# Patient Record
Sex: Male | Born: 1995 | Race: White | Hispanic: No | Marital: Married | Smoking: Never smoker
Health system: Southern US, Community
[De-identification: ages and names within clinical notes are randomized; demographics above are authoritative.]

## PROBLEM LIST (undated history)

## (undated) DIAGNOSIS — F32A Depression, unspecified: Secondary | ICD-10-CM

## (undated) DIAGNOSIS — F419 Anxiety disorder, unspecified: Secondary | ICD-10-CM

## (undated) DIAGNOSIS — K219 Gastro-esophageal reflux disease without esophagitis: Secondary | ICD-10-CM

## (undated) DIAGNOSIS — R569 Unspecified convulsions: Secondary | ICD-10-CM

## (undated) DIAGNOSIS — T7840XA Allergy, unspecified, initial encounter: Secondary | ICD-10-CM

## (undated) DIAGNOSIS — F329 Major depressive disorder, single episode, unspecified: Secondary | ICD-10-CM

## (undated) DIAGNOSIS — F319 Bipolar disorder, unspecified: Secondary | ICD-10-CM

## (undated) HISTORY — PX: TONSILLECTOMY: SUR1361

## (undated) HISTORY — DX: Allergy, unspecified, initial encounter: T78.40XA

## (undated) HISTORY — DX: Unspecified convulsions: R56.9

## (undated) HISTORY — DX: Gastro-esophageal reflux disease without esophagitis: K21.9

---
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
## 2017-03-30 ENCOUNTER — Ambulatory Visit (INDEPENDENT_AMBULATORY_CARE_PROVIDER_SITE_OTHER): Payer: Medicaid Other | Admitting: *Deleted

## 2017-03-30 DIAGNOSIS — Z23 Encounter for immunization: Secondary | ICD-10-CM | POA: Diagnosis not present

## 2017-04-06 ENCOUNTER — Telehealth: Payer: Self-pay | Admitting: Family Medicine

## 2017-04-06 NOTE — Telephone Encounter (Signed)
Pt's mother notified pt can come in to get plain Tetanus But won't need another Tdap Mother verbalizes understanding

## 2017-04-08 ENCOUNTER — Ambulatory Visit: Payer: Medicaid Other | Admitting: Family Medicine

## 2017-04-08 ENCOUNTER — Encounter: Payer: Self-pay | Admitting: Family Medicine

## 2017-04-08 ENCOUNTER — Ambulatory Visit (INDEPENDENT_AMBULATORY_CARE_PROVIDER_SITE_OTHER): Payer: Medicaid Other | Admitting: Family Medicine

## 2017-04-08 VITALS — BP 138/80 | HR 96 | Temp 98.9°F | Ht 63.0 in | Wt 158.0 lb

## 2017-04-08 DIAGNOSIS — J029 Acute pharyngitis, unspecified: Secondary | ICD-10-CM | POA: Diagnosis not present

## 2017-04-08 DIAGNOSIS — J0100 Acute maxillary sinusitis, unspecified: Secondary | ICD-10-CM | POA: Diagnosis not present

## 2017-04-08 DIAGNOSIS — Z23 Encounter for immunization: Secondary | ICD-10-CM

## 2017-04-08 LAB — RAPID STREP SCREEN (MED CTR MEBANE ONLY): STREP GP A AG, IA W/REFLEX: NEGATIVE

## 2017-04-08 LAB — CULTURE, GROUP A STREP

## 2017-04-08 MED ORDER — AMOXICILLIN-POT CLAVULANATE 875-125 MG PO TABS: 1.0000 | ORAL_TABLET | Freq: Two times a day (BID) | ORAL | 0 refills | Status: DC

## 2017-04-08 MED ORDER — TETANUS-DIPHTHERIA TOXOIDS TD 5-2 LFU IM INJ: 0.5000 mL | INJECTION | Freq: Once | INTRAMUSCULAR | Status: DC

## 2017-04-08 NOTE — Progress Notes (Deleted)
Subjective: CC: sinus symptoms PCP: Ryan [name], Ryan [name], Ryan [name] OZH:YQMVHQHPI:Ryan [name] is [name] 21 y.o. male presenting to clinic today for:  1. Sinus symptoms  Patient reports *** that started ***.  *** cough, hemoptysis, congestion, rhinorrhea, sinus pressure, headache, SOB, dizziness, rash, nausea, vomiting, diarrhea, fevers, chills, myalgia, sick contacts, recent travel.  Patient has used *** with *** relief of symptoms.  *** history of COPD or asthma.  *** tobacco use/ exposure.    ROS: Per HPI  Allergies  Allergen Reactions  . Sulfa Antibiotics Rash   Past Medical History:  Diagnosis Date  . Anxiety   . Bipolar affective disorder (HCC)   . Depression   . Seizures (HCC)    seizures began in NOV 2013   No current outpatient medications on file. Social History   Socioeconomic History  . Marital status: Single    Spouse name: Not on file  . Number of children: Not on file  . Years of education: Not on file  . Highest education level: Not on file  Social Needs  . Financial resource strain: Not on file  . Food insecurity - worry: Not on file  . Food insecurity - inability: Not on file  . Transportation needs - medical: Not on file  . Transportation needs - non-medical: Not on file  Occupational History  . Not on file  Tobacco Use  . Smoking status: Never Smoker  . Smokeless tobacco: Never Used  Substance and Sexual Activity  . Alcohol use: No    Alcohol/week: 0.0 oz    Comment: 12-17-2015 per pt no   . Drug use: No    Comment: 12-17-15 per pt no   . Sexual activity: No  Other Topics Concern  . Not on file  Social History Narrative  . Not on file   Family History  Problem Relation Age of Onset  . ADD / ADHD Father   . Mood Disorder Father   . ADD / ADHD Sister   . Mood Disorder Sister   . Bipolar disorder Sister   . ADD / ADHD Brother   . Mood Disorder Brother   . Bipolar disorder Brother   . Depression Maternal Grandfather   . Mood Disorder Paternal Grandfather    . Mood Disorder Paternal Grandmother   . ADD / ADHD Sister   . Mood Disorder Sister   . Bipolar disorder Sister   . ADD / ADHD Brother   . Mood Disorder Brother   . Bipolar disorder Brother     Objective: Office vital signs reviewed. There were no vitals taken for this visit.  Physical Examination:  General: Awake, alert, *** nourished, No acute distress HEENT: Normal    Neck: No masses palpated. No lymphadenopathy    Ears: Tympanic membranes intact, normal light reflex, no erythema, no bulging    Eyes: PERRLA, extraocular membranes intact, sclera ***    Nose: nasal turbinates moist, *** nasal discharge    Throat: moist mucus membranes, no erythema, *** tonsillar exudate.  Airway is patent Cardio: regular rate and rhythm, S1S2 heard, no murmurs appreciated Pulm: clear to auscultation bilaterally, no wheezes, rhonchi or rales; normal work of breathing on room air GI: soft, non-tender, non-distended, bowel sounds present x4, no hepatomegaly, no splenomegaly, no masses GU: external vaginal tissue ***, cervix ***, *** punctate lesions on cervix appreciated, *** discharge from cervical os, *** bleeding, *** cervical motion tenderness, *** abdominal/ adnexal masses Extremities: warm, well perfused, No edema, cyanosis or clubbing; +***  pulses bilaterally MSK: *** gait and *** station Skin: dry; intact; no rashes or lesions Neuro: *** Strength and light touch sensation grossly intact, *** DTRs ***/4  Assessment/ Plan: 21 y.o. male   ***  No orders of the defined types were placed in this encounter.  No orders of the defined types were placed in this encounter.    Raliegh IpAshly M Leevon Upperman, DO [facility] [phone]

## 2017-04-08 NOTE — Progress Notes (Signed)
Subjective: CC: sinus symptoms PCP: [name], Elige RadonJoshua A, MD WUJ:WJXBJYHPI:Ryan [name] is a 21 y.o. male presenting to clinic today for:  1. Sinus symptoms  Patient reports sinus pressure/ headache/ facial pain/ rhinorrhea/ nausea/ postnasal drip and sore throat that started 1.5 weeks ago.  He reports subjective fever.  Denies cough, hemoptysis, SOB, dizziness, rash, vomiting, diarrhea, chills, myalgia, sick contacts, recent travel.  Patient has used Mucinex and neti Pot with little relief of symptoms.  Denies history of COPD or asthma.  Denies tobacco use/ exposure.  ROS: Per HPI  Allergies  Allergen Reactions  . Sulfa Antibiotics Rash   Past Medical History:  Diagnosis Date  . Anxiety   . Bipolar affective disorder (HCC)   . Depression   . Seizures (HCC)    seizures began in NOV 2013   No current outpatient medications on file. Social History   Socioeconomic History  . Marital status: Single    Spouse name: Not on file  . Number of children: Not on file  . Years of education: Not on file  . Highest education level: Not on file  Social Needs  . Financial resource strain: Not on file  . Food insecurity - worry: Not on file  . Food insecurity - inability: Not on file  . Transportation needs - medical: Not on file  . Transportation needs - non-medical: Not on file  Occupational History  . Not on file  Tobacco Use  . Smoking status: Never Smoker  . Smokeless tobacco: Never Used  Substance and Sexual Activity  . Alcohol use: No    Alcohol/week: 0.0 oz    Comment: 12-17-2015 per pt no   . Drug use: No    Comment: 12-17-15 per pt no   . Sexual activity: No  Other Topics Concern  . Not on file  Social History Narrative  . Not on file   Family History  Problem Relation Age of Onset  . ADD / ADHD Father   . Mood Disorder Father   . ADD / ADHD Sister   . Mood Disorder Sister   . Bipolar disorder Sister   . ADD / ADHD Brother   . Mood Disorder Brother   . Bipolar disorder  Brother   . Depression Maternal Grandfather   . Mood Disorder Paternal Grandfather   . Mood Disorder Paternal Grandmother   . ADD / ADHD Sister   . Mood Disorder Sister   . Bipolar disorder Sister   . ADD / ADHD Brother   . Mood Disorder Brother   . Bipolar disorder Brother     Objective: Office vital signs reviewed. BP 138/80   Pulse 96   Temp 98.9 F (37.2 C) (Oral)   Ht 5\' 3"  (1.6 m)   Wt 158 lb (71.7 kg)   BMI 27.99 kg/m   Physical Examination:  General: Awake, alert, well nourished, nontoxic appearing, No acute distress HEENT: + TTP to maxillary sinuses    Neck: No masses palpated. No lymphadenopathy    Ears: Tympanic membranes intact, normal light reflex, no erythema, no bulging    Eyes: PERRLA, extraocular membranes intact, sclera white    Nose: nasal turbinates moist, opaque yellow nasal discharge w/ erythematous nasal turbinates.    Throat: moist mucus membranes, mild oropharyngeal erythema, no tonsillar exudate.  Airway is patent Cardio: regular rate and rhythm, S1S2 heard, no murmurs appreciated Pulm: clear to auscultation bilaterally, no wheezes, rhonchi or rales; normal work of breathing on room air  Assessment/  Plan: 21 y.o. male   1. Acute non-recurrent maxillary sinusitis Patient is afebrile with normal vital signs.  His exam was remarkable for bilateral maxillary sinus tenderness, erythematous nasal turbinates with opaque yellow nasal discharge.  Otherwise unremarkable.  Given the duration and waxing waning characteristics of his symptoms, will empirically treat for acute bacterial sinusitis.  Augmentin 875 p.o. twice daily times 10 days prescribed.  I encouraged him to obtain a probiotic or eat yogurt while on this medication.  Push oral fluids.  Continue Nettie pot as needed. Strict return precautions and reasons for emergent evaluation in the emergency department review with patient.  They voiced understanding and will follow-up as needed.  2. Sore  throat Strep negative. - Rapid Strep Screen (Not at [facility])   Orders Placed This Encounter  Procedures  . Rapid Strep Screen (Not at [facility])   Meds ordered this encounter  Medications  . amoxicillin-clavulanate (AUGMENTIN) 875-125 MG tablet    Sig: Take 1 tablet by mouth 2 (two) times daily.    Dispense:  20 tablet    Refill:  0   Additionally, patient required tetanus vaccine prior to leaving for mission trip.  He notes that he will be away for 2 years and will not [place].  This was administered during today's office visit.  Raliegh IpAshly M Magen Suriano, DO [facility] [phone]

## 2017-04-08 NOTE — Patient Instructions (Signed)
Given the duration of your symptoms and the waxing and waning nature of them, you are being treated with an antibiotic.  As we discussed, take this medication twice daily for the next 10 days.  Diarrhea is a common side effect of this medication.  If you experience this, consider use of probiotic or intake of yogurt.  Make sure to drink plenty of fluid.  - Get plenty of rest and drink plenty of fluids. - Try to breathe moist air. Use a cold mist humidifier. - Consume warm fluids (soup or tea) to provide relief for a stuffy nose and to loosen phlegm. - For nasal stuffiness, try saline nasal spray or a Neti Pot. Afrin nasal spray can also be used but this product should not be used longer than 3 days or it will cause rebound nasal stuffiness (worsening nasal congestion). - For sore throat pain relief: suck on throat lozenges, hard candy or popsicles; gargle with warm salt water (1/4 tsp. salt per 8 oz. of water); and eat soft, bland foods. - Eat a well-balanced diet. If you cannot, ensure you are getting enough nutrients by taking a daily multivitamin. - Avoid dairy products, as they can thicken phlegm. - Avoid alcohol, as it impairs your body's immune system.  CONTACT YOUR DOCTOR IF YOU EXPERIENCE ANY OF THE FOLLOWING: - High fever - Ear pain - Sinus-type headache - Unusually severe cold symptoms - Cough that gets worse while other cold symptoms improve - Flare up of any chronic lung problem, such as asthma - Your symptoms persist longer than 2 weeks

## 2017-04-08 NOTE — Addendum Note (Signed)
Addended byDory Peru: RINTELMANN, GINA C on: 04/08/2017 04:57 PM   Modules accepted: Orders

## 2017-05-06 ENCOUNTER — Telehealth (HOSPITAL_COMMUNITY): Payer: Self-pay | Admitting: *Deleted

## 2017-05-06 NOTE — Telephone Encounter (Signed)
Dr Tenny Crawoss, Patient's mom called & stated that Ryan [name] has been having high anxiety along with depression. They have a appointment on 05-11-17 & have been placed on the cancellation list.  Mom's question  is if you could prescribe him something before the appointment on the 9th? I explained that  a evualation is needed face to face.

## 2017-05-06 NOTE — Telephone Encounter (Signed)
I have not seen him in almost a year. Will need to see him in the office

## 2017-05-09 NOTE — Telephone Encounter (Signed)
Spoke with & that per Dr Tenny Crawoss: [facility] [place] to fill any medications due to I have not seen him in almost a year. Will need to see him in the office

## 2017-05-11 ENCOUNTER — Encounter (HOSPITAL_COMMUNITY): Payer: Self-pay | Admitting: Psychiatry

## 2017-05-11 ENCOUNTER — Ambulatory Visit (INDEPENDENT_AMBULATORY_CARE_PROVIDER_SITE_OTHER): Payer: Self-pay | Admitting: Psychiatry

## 2017-05-11 VITALS — BP 133/78 | HR 67 | Ht 63.0 in | Wt 151.0 lb

## 2017-05-11 DIAGNOSIS — Z818 Family history of other mental and behavioral disorders: Secondary | ICD-10-CM

## 2017-05-11 DIAGNOSIS — F3162 Bipolar disorder, current episode mixed, moderate: Secondary | ICD-10-CM

## 2017-05-11 DIAGNOSIS — G4700 Insomnia, unspecified: Secondary | ICD-10-CM

## 2017-05-11 DIAGNOSIS — F419 Anxiety disorder, unspecified: Secondary | ICD-10-CM

## 2017-05-11 DIAGNOSIS — R450 Nervousness: Secondary | ICD-10-CM

## 2017-05-11 MED ORDER — PAROXETINE HCL 40 MG PO TABS: 40.0000 mg | ORAL_TABLET | Freq: Every day | ORAL | 2 refills | Status: DC

## 2017-05-11 MED ORDER — GABAPENTIN 300 MG PO CAPS: 300.0000 mg | ORAL_CAPSULE | Freq: Three times a day (TID) | ORAL | 2 refills | Status: DC

## 2017-05-11 NOTE — Progress Notes (Signed)
BH MD/PA/NP OP Progress Note  05/11/2017 10:03 AM Ryan [name]  MRN:  [id number]  Chief Complaint:  Chief Complaint    Depression; Anxiety; Follow-up     HPI: Patient is a 22 year old white male who lives with his mother brother and 2 sisters in [place].  He has completed high school and was on a mission for the [employer] but returned early about 1 week ago.  The patient returns for follow-up after a long absence.  He was last seen in February 2018.  He is here with his mother.  He has a history of bipolar disorder depression and ADHD.  He also has had a history of significant anxiety.  When I last saw the patient last year he claimed that he did not need medication anymore.  He was working as a Conservation officer, naturecashier at [employer] and was doing very well.  He denies any symptoms of depression anxiety or difficulty focusing.  He was trying to teach himself Spanish.  The patient left about 4 weeks ago for a mission training center in the [employer].  This was in [place] and he was being trained in Spanish immersion.  After this he was supposed to go to [facility] [employer] [facility] [place] for his actual admission.  However at the training center after about 2 weeks he got increasingly depressed and anxious.  He was not able to sleep or eat.  He had significant panic attacks.  He began entertaining suicidal thoughts without plan.  He was supposed to stay there for 6 weeks but came home after 3 weeks as the administrators of the program did not think he could handle it.  He found some Paxil that he used to take and has begun taking it but is not sure of the dose.  His mother is also found a Veterinary surgeoncounselor in [place] for him to see.  Apparently he will not be allowed to retrain with the Mission program for at least 5 or 6 months.  The patient states that he has been better since he got home.  He is no longer suicidal.  He is taking melatonin and Benadryl to help him sleep.  He is still very anxious but seems a bit less depressed.  He is  to be on gabapentin for anxiety and we can reinstate this as well as continue on the Paxil.  He denies any psychotic symptoms.  He does not have much insight as to why he got like this at the training center.  However his mother describes it is very much like [employer] with 10-12 hours of constant study and training through the day all in [place].  It seems as if it was a little bit overwhelming for him.   Visit Diagnosis:    ICD-10-CM   1. Bipolar 1 disorder, mixed, moderate (HCC) F31.62     Past Psychiatric History: Long-term outpatient treatment for ADHD and anxiety and possible bipolar disorder  Past Medical History:  Past Medical History:  Diagnosis Date  . Anxiety   . Bipolar affective disorder (HCC)   . Depression   . Seizures (HCC)    seizures began in NOV 2013    Past Surgical History:  Procedure Laterality Date  . TONSILLECTOMY      Family Psychiatric History: See below  Family History:  Family History  Problem Relation Age of Onset  . ADD / ADHD Father   . Mood Disorder Father   . ADD / ADHD Sister   . Mood Disorder Sister   .  Bipolar disorder Sister   . ADD / ADHD Brother   . Mood Disorder Brother   . Bipolar disorder Brother   . Depression Maternal Grandfather   . Mood Disorder Paternal Grandfather   . Mood Disorder Paternal Grandmother   . ADD / ADHD Sister   . Mood Disorder Sister   . Bipolar disorder Sister   . ADD / ADHD Brother   . Mood Disorder Brother   . Bipolar disorder Brother     Social History:  Social History   Socioeconomic History  . Marital status: Single    Spouse name: None  . Number of children: None  . Years of education: None  . Highest education level: None  Social Needs  . Financial resource strain: None  . Food insecurity - worry: None  . Food insecurity - inability: None  . Transportation needs - medical: None  . Transportation needs - non-medical: None  Occupational History  . None  Tobacco Use  . Smoking status:  Never Smoker  . Smokeless tobacco: Never Used  Substance and Sexual Activity  . Alcohol use: No    Alcohol/week: 0.0 oz    Comment: 12-17-2015 per pt no   . Drug use: No    Comment: 12-17-15 per pt no   . Sexual activity: No  Other Topics Concern  . None  Social History Narrative  . None    Allergies:  Allergies  Allergen Reactions  . Sulfa Antibiotics Rash    Metabolic Disorder Labs: No results found for: HGBA1C, MPG No results found for: PROLACTIN No results found for: CHOL, TRIG, HDL, CHOLHDL, VLDL, LDLCALC No results found for: TSH  Therapeutic Level Labs: No results found for: LITHIUM Lab Results  Component Value Date   VALPROATE 106 (H) 05/18/2013   VALPROATE 64.9 03/10/2012   No components found for:  CBMZ  Current Medications: Current Outpatient Medications  Medication Sig Dispense Refill  . amoxicillin-clavulanate (AUGMENTIN) 875-125 MG tablet Take 1 tablet by mouth 2 (two) times daily. (Patient not taking: Reported on 05/11/2017) 20 tablet 0  . gabapentin (NEURONTIN) 300 MG capsule Take 1 capsule (300 mg total) by mouth 3 (three) times daily. 90 capsule 2  . PARoxetine (PAXIL) 40 MG tablet Take 1 tablet (40 mg total) by mouth daily. 30 tablet 2   No current facility-administered medications for this visit.      Musculoskeletal: Strength & Muscle Tone: within normal limits Gait & Station: normal Patient leans: N/A  Psychiatric Specialty Exam: Review of Systems  Psychiatric/Behavioral: Positive for depression. The patient is nervous/anxious and has insomnia.   All other systems reviewed and are negative.   Blood pressure 133/78, pulse 67, height 5\' 3"  (1.6 m), weight 151 lb (68.5 kg), SpO2 97 %.Body mass index is 26.75 kg/m.  General Appearance: Casual and Fairly Groomed  Eye Contact:  Good  Speech:  Clear and Coherent  Volume:  Normal  Mood:  Anxious and Dysphoric  Affect:  Congruent  Thought Process:  Goal Directed  Orientation:  Full (Time,  Place, and Person)  Thought Content: Rumination   Suicidal Thoughts:  No  Homicidal Thoughts:  No  Memory:  Immediate;   Good Recent;   Good Remote;   Fair  Judgement:  Fair  Insight:  Lacking  Psychomotor Activity:  Normal  Concentration:  Concentration: Fair and Attention Span: Fair  Recall:  Good  Fund of Knowledge: Good  Language: Good  Akathisia:  No  Handed:  Right  AIMS (if  indicated): not done  Assets:  Communication Skills Desire for Improvement Physical Health Resilience Social Support Talents/Skills  ADL's:  Intact  Cognition: WNL  Sleep:  Fair   Screenings: PHQ2-9     Office Visit from 04/08/2017 in [facility] Office Visit from 11/10/2016 in [facility] Office Visit from 09/28/2016 in [facility] Office Visit from 12/12/2015 in [facility]  PHQ-2 Total Score  0  0  1  0       Assessment and Plan: This patient is a 22 year old male with a history of ADHD anxiety and possibly bipolar disorder.  He was thrust into a new environment in a different country with a very intense rigorous schedule.  This seems to have been too much for him.  Fortunately he is going to be receiving counseling.  He will continue Paxil 40 mg daily for depression and anxiety and added gabapentin 300 mg 3 times a day for anxiety as well.  He will return to see me in 4 weeks   Diannia Ruder, MD 05/11/2017, 10:03 AM

## 2017-06-20 ENCOUNTER — Ambulatory Visit (HOSPITAL_COMMUNITY): Payer: Medicaid Other | Admitting: Psychiatry

## 2017-07-12 ENCOUNTER — Ambulatory Visit (INDEPENDENT_AMBULATORY_CARE_PROVIDER_SITE_OTHER): Payer: Medicaid Other | Admitting: Psychiatry

## 2017-07-12 ENCOUNTER — Encounter (HOSPITAL_COMMUNITY): Payer: Self-pay | Admitting: Psychiatry

## 2017-07-12 VITALS — BP 115/58 | HR 67 | Ht 63.0 in | Wt 158.0 lb

## 2017-07-12 DIAGNOSIS — F3162 Bipolar disorder, current episode mixed, moderate: Secondary | ICD-10-CM

## 2017-07-12 DIAGNOSIS — Z818 Family history of other mental and behavioral disorders: Secondary | ICD-10-CM | POA: Diagnosis not present

## 2017-07-12 MED ORDER — GABAPENTIN 300 MG PO CAPS: 300.0000 mg | ORAL_CAPSULE | Freq: Three times a day (TID) | ORAL | 2 refills | Status: DC

## 2017-07-12 MED ORDER — PAROXETINE HCL 40 MG PO TABS: 40.0000 mg | ORAL_TABLET | Freq: Every day | ORAL | 2 refills | Status: DC

## 2017-07-12 NOTE — Progress Notes (Signed)
BH MD/PA/NP OP Progress Note  07/12/2017 11:12 AM Ryan [name]  MRN:  [id number]  Chief Complaint:  Chief Complaint    Depression; Anxiety; Follow-up     HPI: This patient is a [age] year old white male who lives with his family in [place].  He had completed high school and was on a mission for the [employer] but returned at the end of December due to a severe anxiety.  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  At that time he had just come back from the mission training center for the [employer] in [place].  He got overwhelmed depressed and anxious and had to come home.  He is now working for his uncle and is doing much better.  His mood is good he is sleeping fairly well with melatonin and his anxiety is under good control.  He is trying to get a little bit of exercise but is spending a lot of time at work.  He no longer has any suicidal thoughts and seems very positive Visit Diagnosis:    ICD-10-CM   1. Bipolar 1 disorder, mixed, moderate (HCC) F31.62     Past Psychiatric History: Long-term outpatient treatment for ADHD anxiety and possible bipolar disorder  Past Medical History:  Past Medical History:  Diagnosis Date  . Anxiety   . Bipolar affective disorder (HCC)   . Depression   . Seizures (HCC)    seizures began in NOV 2013    Past Surgical History:  Procedure Laterality Date  . TONSILLECTOMY      Family Psychiatric History: See below  Family History:  Family History  Problem Relation Age of Onset  . ADD / ADHD Father   . Mood Disorder Father   . ADD / ADHD Sister   . Mood Disorder Sister   . Bipolar disorder Sister   . ADD / ADHD Brother   . Mood Disorder Brother   . Bipolar disorder Brother   . Depression Maternal Grandfather   . Mood Disorder Paternal Grandfather   . Mood Disorder Paternal Grandmother   . ADD / ADHD Sister   . Mood Disorder Sister   . Bipolar disorder Sister   . ADD /  ADHD Brother   . Mood Disorder Brother   . Bipolar disorder Brother     Social History:  Social History   Socioeconomic History  . Marital status: Single    Spouse name: None  . Number of children: None  . Years of education: None  . Highest education level: None  Social Needs  . Financial resource strain: None  . Food insecurity - worry: None  . Food insecurity - inability: None  . Transportation needs - medical: None  . Transportation needs - non-medical: None  Occupational History  . None  Tobacco Use  . Smoking status: Never Smoker  . Smokeless tobacco: Never Used  Substance and Sexual Activity  . Alcohol use: No    Alcohol/week: 0.0 oz    Comment: 12-17-2015 per pt no   . Drug use: No    Comment: 12-17-15 per pt no   . Sexual activity: No  Other Topics Concern  . None  Social History Narrative  . None    Allergies:  Allergies  Allergen Reactions  . Sulfa Antibiotics Rash    Metabolic Disorder Labs: No results found for: HGBA1C, MPG No results found for: PROLACTIN No results found  for: CHOL, TRIG, HDL, CHOLHDL, VLDL, LDLCALC No results found for: TSH  Therapeutic Level Labs: No results found for: LITHIUM Lab Results  Component Value Date   VALPROATE 106 (H) 05/18/2013   VALPROATE 64.9 03/10/2012   No components found for:  CBMZ  Current Medications: Current Outpatient Medications  Medication Sig Dispense Refill  . gabapentin (NEURONTIN) 300 MG capsule Take 1 capsule (300 mg total) by mouth 3 (three) times daily. 90 capsule 2  . PARoxetine (PAXIL) 40 MG tablet Take 1 tablet (40 mg total) by mouth daily. 30 tablet 2  . amoxicillin-clavulanate (AUGMENTIN) 875-125 MG tablet Take 1 tablet by mouth 2 (two) times daily. (Patient not taking: Reported on 07/12/2017) 20 tablet 0   No current facility-administered medications for this visit.      Musculoskeletal: Strength & Muscle Tone: within normal limits Gait & Station: normal Patient leans:  N/A  Psychiatric Specialty Exam: Review of Systems  All other systems reviewed and are negative.   Blood pressure (!) 115/58, pulse 67, height 5\' 3"  (1.6 m), weight 158 lb (71.7 kg), SpO2 98 %.Body mass index is 27.99 kg/m.  General Appearance: Casual and Fairly Groomed  Eye Contact:  Good  Speech:  Clear and Coherent  Volume:  Normal  Mood:  Euthymic  Affect:  Congruent  Thought Process:  Goal Directed  Orientation:  Full (Time, Place, and Person)  Thought Content: WDL   Suicidal Thoughts:  No  Homicidal Thoughts:  No  Memory:  Immediate;   Good Recent;   Good Remote;   Good  Judgement:  Fair  Insight:  Fair  Psychomotor Activity:  Normal  Concentration:  Concentration: Good and Attention Span: Good  Recall:  Good  Fund of Knowledge: Good  Language: Good  Akathisia:  No  Handed:  Right  AIMS (if indicated): not done  Assets:  Communication Skills Desire for Improvement Physical Health Resilience Social Support Talents/Skills  ADL's:  Intact  Cognition: WNL  Sleep:  Fair   Screenings: PHQ2-9     Office Visit from 04/08/2017 in [facility] Office Visit from 11/10/2016 in [facility] Office Visit from 09/28/2016 in [facility] Office Visit from 12/12/2015 in [facility]  PHQ-2 Total Score  0  0  1  0       Assessment and Plan: This patient is a 22 year old male with a history of mood disorder and anxiety.  He is doing well on his current regimen and does not feel like he needs medication for focus right now.  He will continue Paxil 40 mg daily for anxiety and depression and gabapentin 300 mg 3 times a day for anxiety.  He will return to see me in 3 months   Diannia Ruder, MD 07/12/2017, 11:12 AM

## 2017-09-10 DIAGNOSIS — M79646 Pain in unspecified finger(s): Secondary | ICD-10-CM | POA: Diagnosis not present

## 2017-09-10 DIAGNOSIS — S61219A Laceration without foreign body of unspecified finger without damage to nail, initial encounter: Secondary | ICD-10-CM | POA: Diagnosis not present

## 2017-10-13 ENCOUNTER — Telehealth (HOSPITAL_COMMUNITY): Payer: Self-pay | Admitting: *Deleted

## 2017-10-13 ENCOUNTER — Ambulatory Visit (HOSPITAL_COMMUNITY): Payer: Medicaid Other | Admitting: Psychiatry

## 2017-10-13 ENCOUNTER — Other Ambulatory Visit (HOSPITAL_COMMUNITY): Payer: Self-pay | Admitting: Psychiatry

## 2017-10-13 DIAGNOSIS — F988 Other specified behavioral and emotional disorders with onset usually occurring in childhood and adolescence: Secondary | ICD-10-CM

## 2017-10-13 MED ORDER — DEXMETHYLPHENIDATE HCL ER 20 MG PO CP24: 20.0000 mg | ORAL_CAPSULE | Freq: Every day | ORAL | 0 refills | Status: DC

## 2017-10-13 MED ORDER — GABAPENTIN 300 MG PO CAPS: 300.0000 mg | ORAL_CAPSULE | Freq: Three times a day (TID) | ORAL | 2 refills | Status: DC

## 2017-10-13 MED ORDER — PAROXETINE HCL 40 MG PO TABS: 40.0000 mg | ORAL_TABLET | Freq: Every day | ORAL | 2 refills | Status: DC

## 2017-10-13 NOTE — Telephone Encounter (Signed)
Sent , also focalin xr 20 mg

## 2017-10-13 NOTE — Telephone Encounter (Signed)
Dr Tenny Crawoss Appointment was rescheduled per Mom phone tree message gave different appointment time. Mom is requesting Refill on all Medication's. 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 Rescheduled next appointment is 11-08-17

## 2017-11-08 ENCOUNTER — Ambulatory Visit (INDEPENDENT_AMBULATORY_CARE_PROVIDER_SITE_OTHER): Payer: Medicaid Other | Admitting: Psychiatry

## 2017-11-08 ENCOUNTER — Encounter (HOSPITAL_COMMUNITY): Payer: Self-pay | Admitting: Psychiatry

## 2017-11-08 VITALS — BP 125/75 | HR 88 | Ht 63.0 in | Wt 168.0 lb

## 2017-11-08 DIAGNOSIS — F900 Attention-deficit hyperactivity disorder, predominantly inattentive type: Secondary | ICD-10-CM | POA: Diagnosis not present

## 2017-11-08 DIAGNOSIS — F3162 Bipolar disorder, current episode mixed, moderate: Secondary | ICD-10-CM | POA: Diagnosis not present

## 2017-11-08 MED ORDER — LISDEXAMFETAMINE DIMESYLATE 20 MG PO CAPS: 20.0000 mg | ORAL_CAPSULE | Freq: Every day | ORAL | 0 refills | Status: DC

## 2017-11-08 MED ORDER — PAROXETINE HCL 40 MG PO TABS: 40.0000 mg | ORAL_TABLET | Freq: Every day | ORAL | 2 refills | Status: DC

## 2017-11-08 MED ORDER — GABAPENTIN 300 MG PO CAPS: 300.0000 mg | ORAL_CAPSULE | Freq: Three times a day (TID) | ORAL | 2 refills | Status: DC

## 2017-11-08 NOTE — Progress Notes (Signed)
BH MD/PA/NP OP Progress Note  11/08/2017 3:15 PM Ryan [name]  MRN:  [id number]  Chief Complaint:  Chief Complaint    ADHD; Depression; Anxiety; Follow-up     HPI: This patient is a 22 year old white male who lives with his family in [place].  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  The patient returns with his mother after 4 months.  He states that overall he is doing fairly well.  His mood is under good control and he is no longer depressed or anxious.  He denies any thoughts of self-harm or suicide.  He is having trouble focusing and would like to go back on Vyvanse but other than that he has no specific complaints he states that he loves doing his job. Visit Diagnosis:    ICD-10-CM   1. Attention deficit hyperactivity disorder (ADHD), predominantly inattentive type F90.0   2. Bipolar 1 disorder, mixed, moderate (HCC) F31.62     Past Psychiatric History: Long-term outpatient treatment for ADHD anxiety and possible bipolar disorder  Past Medical History:  Past Medical History:  Diagnosis Date  . Anxiety   . Bipolar affective disorder (HCC)   . Depression   . Seizures (HCC)    seizures began in NOV 2013    Past Surgical History:  Procedure Laterality Date  . TONSILLECTOMY      Family Psychiatric History: See below  Family History:  Family History  Problem Relation Age of Onset  . ADD / ADHD Father   . Mood Disorder Father   . ADD / ADHD Sister   . Mood Disorder Sister   . Bipolar disorder Sister   . ADD / ADHD Brother   . Mood Disorder Brother   . Bipolar disorder Brother   . Depression Maternal Grandfather   . Mood Disorder Paternal Grandfather   . Mood Disorder Paternal Grandmother   . ADD / ADHD Sister   . Mood Disorder Sister   . Bipolar disorder Sister   . ADD / ADHD Brother   . Mood Disorder Brother   . Bipolar disorder Brother     Social History:  Social History   Socioeconomic History  . Marital status: Single   Spouse name: Not on file  . Number of children: Not on file  . Years of education: Not on file  . Highest education level: Not on file  Occupational History  . Not on file  Social Needs  . Financial resource strain: Not on file  . Food insecurity:    Worry: Not on file    Inability: Not on file  . Transportation needs:    Medical: Not on file    Non-medical: Not on file  Tobacco Use  . Smoking status: Never Smoker  . Smokeless tobacco: Never Used  Substance and Sexual Activity  . Alcohol use: No    Alcohol/week: 0.0 oz    Comment: 12-17-2015 per pt no   . Drug use: No    Comment: 12-17-15 per pt no   . Sexual activity: Never  Lifestyle  . Physical activity:    Days per week: Not on file    Minutes per session: Not on file  . Stress: Not on file  Relationships  . Social connections:    Talks on phone: Not on file    Gets together: Not on file    Attends religious service: Not on file    Active member of club or organization: Not on file  Attends meetings of clubs or organizations: Not on file    Relationship status: Not on file  Other Topics Concern  . Not on file  Social History Narrative  . Not on file    Allergies:  Allergies  Allergen Reactions  . Sulfa Antibiotics Rash    Metabolic Disorder Labs: No results found for: HGBA1C, MPG No results found for: PROLACTIN No results found for: CHOL, TRIG, HDL, CHOLHDL, VLDL, LDLCALC No results found for: TSH  Therapeutic Level Labs: No results found for: LITHIUM Lab Results  Component Value Date   VALPROATE 106 (H) 05/18/2013   VALPROATE 64.9 03/10/2012   No components found for:  CBMZ  Current Medications: Current Outpatient Medications  Medication Sig Dispense Refill  . amoxicillin-clavulanate (AUGMENTIN) 875-125 MG tablet Take 1 tablet by mouth 2 (two) times daily. 20 tablet 0  . gabapentin (NEURONTIN) 300 MG capsule Take 1 capsule (300 mg total) by mouth 3 (three) times daily. 90 capsule 2  .  PARoxetine (PAXIL) 40 MG tablet Take 1 tablet (40 mg total) by mouth daily. 30 tablet 2  . lisdexamfetamine (VYVANSE) 20 MG capsule Take 1 capsule (20 mg total) by mouth daily. 30 capsule 0  . lisdexamfetamine (VYVANSE) 20 MG capsule Take 1 capsule (20 mg total) by mouth daily. 30 capsule 0  . lisdexamfetamine (VYVANSE) 20 MG capsule Take 1 capsule (20 mg total) by mouth daily. 30 capsule 0   No current facility-administered medications for this visit.      Musculoskeletal: Strength & Muscle Tone: within normal limits Gait & Station: normal Patient leans: N/A  Psychiatric Specialty Exam: Review of Systems  All other systems reviewed and are negative.   Blood pressure 125/75, pulse 88, height 5\' 3"  (1.6 m), weight 168 lb (76.2 kg), SpO2 99 %.Body mass index is 29.76 kg/m.  General Appearance: Casual and Fairly Groomed  Eye Contact:  Good  Speech:  Clear and Coherent  Volume:  Normal  Mood:  Euthymic  Affect:  Congruent  Thought Process:  Goal Directed  Orientation:  Full (Time, Place, and Person)  Thought Content: WDL   Suicidal Thoughts:  No  Homicidal Thoughts:  No  Memory:  Immediate;   Good Recent;   Good Remote;   Good  Judgement:  Good  Insight:  Fair  Psychomotor Activity:  Normal  Concentration:  Concentration: Fair and Attention Span: Fair  Recall:  Good  Fund of Knowledge: Good  Language: Good  Akathisia:  No  Handed:  Right  AIMS (if indicated): not done  Assets:  Communication Skills Desire for Improvement Physical Health Resilience Social Support Talents/Skills  ADL's:  Intact  Cognition: WNL  Sleep:  Good   Screenings: PHQ2-9     Office Visit from 04/08/2017 in [facility] Office Visit from 11/10/2016 in [facility] Office Visit from 09/28/2016 in [facility] Office Visit from 12/12/2015 in [facility]  PHQ-2 Total Score  0  0  1  0       Assessment and  Plan: Patient is a 22 year old male with a history of depression anxiety and ADHD.  He is doing well on his current regimen.  He will continue Paxil 40 mg daily for depression and anxiety, gabapentin 300 mg 3 times daily for anxiety and will restart Vyvanse 20 mg every morning for focus.  He will return to see me in 3 months   Diannia Ruder, MD 11/08/2017, 3:15 PM

## 2018-02-08 ENCOUNTER — Ambulatory Visit (INDEPENDENT_AMBULATORY_CARE_PROVIDER_SITE_OTHER): Payer: Medicaid Other | Admitting: Psychiatry

## 2018-02-08 ENCOUNTER — Encounter (HOSPITAL_COMMUNITY): Payer: Self-pay | Admitting: Psychiatry

## 2018-02-08 VITALS — BP 130/70 | HR 77 | Ht 63.0 in | Wt 173.0 lb

## 2018-02-08 DIAGNOSIS — F900 Attention-deficit hyperactivity disorder, predominantly inattentive type: Secondary | ICD-10-CM | POA: Diagnosis not present

## 2018-02-08 DIAGNOSIS — F3162 Bipolar disorder, current episode mixed, moderate: Secondary | ICD-10-CM | POA: Diagnosis not present

## 2018-02-08 MED ORDER — PAROXETINE HCL 20 MG PO TABS: 30.0000 mg | ORAL_TABLET | Freq: Every day | ORAL | 2 refills | Status: DC

## 2018-02-08 MED ORDER — LISDEXAMFETAMINE DIMESYLATE 20 MG PO CAPS: 20.0000 mg | ORAL_CAPSULE | Freq: Every day | ORAL | 0 refills | Status: DC

## 2018-02-08 MED ORDER — GABAPENTIN 300 MG PO CAPS: 300.0000 mg | ORAL_CAPSULE | Freq: Three times a day (TID) | ORAL | 2 refills | Status: DC

## 2018-02-08 MED ORDER — PAROXETINE HCL 20 MG PO TABS
30.0000 mg | ORAL_TABLET | Freq: Every day | ORAL | 2 refills | Status: DC
Start: 2018-02-08 — End: 2018-02-08

## 2018-02-08 NOTE — Progress Notes (Signed)
BH MD/PA/NP OP Progress Note  02/08/2018 9:49 AM Ryan [name]  MRN:  [id number]  Chief Complaint:  Chief Complaint    Depression; Anxiety; ADHD; Follow-up     HPI: This patient is a 22 year old single white male who lives with his family in [place].  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  The patient returns on his own today.  He states that he is doing quite well.  He really enjoys his job and is learning a good deal.  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  His mood is under good control he is no longer depressed or anxious.  He is sleeping well and his energy is good.  He is focusing well with the Vyvanse.  He does state that he is drowsy through the day and I suggested that we cut back on the Paxil that he takes at night as it can be a very sedating medication.  He is in agreement. Visit Diagnosis:    ICD-10-CM   1. Attention deficit hyperactivity disorder (ADHD), predominantly inattentive type F90.0   2. Bipolar 1 disorder, mixed, moderate (HCC) F31.62     Past Psychiatric History: Long-term outpatient treatment for ADHD anxiety and possible bipolar disorder  Past Medical History:  Past Medical History:  Diagnosis Date  . Anxiety   . Bipolar affective disorder (HCC)   . Depression   . Seizures (HCC)    seizures began in NOV 2013    Past Surgical History:  Procedure Laterality Date  . TONSILLECTOMY      Family Psychiatric History: See below  Family History:  Family History  Problem Relation Age of Onset  . ADD / ADHD Father   . Mood Disorder Father   . ADD / ADHD Sister   . Mood Disorder Sister   . Bipolar disorder Sister   . ADD / ADHD Brother   . Mood Disorder Brother   . Bipolar disorder Brother   . Depression Maternal Grandfather   . Mood Disorder Paternal Grandfather   . Mood Disorder Paternal Grandmother   . ADD / ADHD Sister   . Mood Disorder Sister   . Bipolar disorder Sister   . ADD  / ADHD Brother   . Mood Disorder Brother   . Bipolar disorder Brother     Social History:  Social History   Socioeconomic History  . Marital status: Single    Spouse name: Not on file  . Number of children: Not on file  . Years of education: Not on file  . Highest education level: Not on file  Occupational History  . Not on file  Social Needs  . Financial resource strain: Not on file  . Food insecurity:    Worry: Not on file    Inability: Not on file  . Transportation needs:    Medical: Not on file    Non-medical: Not on file  Tobacco Use  . Smoking status: Never Smoker  . Smokeless tobacco: Never Used  Substance and Sexual Activity  . Alcohol use: No    Alcohol/week: 0.0 standard drinks    Comment: 12-17-2015 per pt no   . Drug use: No    Comment: 12-17-15 per pt no   . Sexual activity: Never  Lifestyle  . Physical activity:    Days per week: Not on file    Minutes per session: Not on file  . Stress: Not on file  Relationships  .  Social connections:    Talks on phone: Not on file    Gets together: Not on file    Attends religious service: Not on file    Active member of club or organization: Not on file    Attends meetings of clubs or organizations: Not on file    Relationship status: Not on file  Other Topics Concern  . Not on file  Social History Narrative  . Not on file    Allergies:  Allergies  Allergen Reactions  . Sulfa Antibiotics Rash    Metabolic Disorder Labs: No results found for: HGBA1C, MPG No results found for: PROLACTIN No results found for: CHOL, TRIG, HDL, CHOLHDL, VLDL, LDLCALC No results found for: TSH  Therapeutic Level Labs: No results found for: LITHIUM Lab Results  Component Value Date   VALPROATE 106 (H) 05/18/2013   VALPROATE 64.9 03/10/2012   No components found for:  CBMZ  Current Medications: Current Outpatient Medications  Medication Sig Dispense Refill  . amoxicillin-clavulanate (AUGMENTIN) 875-125 MG tablet  Take 1 tablet by mouth 2 (two) times daily. 20 tablet 0  . gabapentin (NEURONTIN) 300 MG capsule Take 1 capsule (300 mg total) by mouth 3 (three) times daily. 90 capsule 2  . lisdexamfetamine (VYVANSE) 20 MG capsule Take 1 capsule (20 mg total) by mouth daily. 30 capsule 0  . lisdexamfetamine (VYVANSE) 20 MG capsule Take 1 capsule (20 mg total) by mouth daily. 30 capsule 0  . lisdexamfetamine (VYVANSE) 20 MG capsule Take 1 capsule (20 mg total) by mouth daily. 30 capsule 0  . PARoxetine (PAXIL) 20 MG tablet Take 1.5 tablets (30 mg total) by mouth daily. 45 tablet 2   No current facility-administered medications for this visit.      Musculoskeletal: Strength & Muscle Tone: within normal limits Gait & Station: normal Patient leans: N/A  Psychiatric Specialty Exam: Review of Systems  Constitutional: Positive for malaise/fatigue.  All other systems reviewed and are negative.   Blood pressure 130/70, pulse 77, height 5\' 3"  (1.6 m), weight 173 lb (78.5 kg), SpO2 99 %.Body mass index is 30.65 kg/m.  General Appearance: Casual and Fairly Groomed  Eye Contact:  Good  Speech:  Clear and Coherent  Volume:  Normal  Mood:  Euthymic  Affect:  Congruent  Thought Process:  Goal Directed  Orientation:  Full (Time, Place, and Person)  Thought Content: WDL   Suicidal Thoughts:  No  Homicidal Thoughts:  No  Memory:  Immediate;   Good Recent;   Good Remote;   Good  Judgement:  Good  Insight:  Fair  Psychomotor Activity:  EPS  Concentration:  Concentration: Good and Attention Span: Good  Recall:  Good  Fund of Knowledge: Good  Language: Good  Akathisia:  No  Handed:  Right  AIMS (if indicated): not done  Assets:  Communication Skills Desire for Improvement Physical Health Resilience Social Support Talents/Skills  ADL's:  Intact  Cognition: WNL  Sleep:  Good   Screenings: PHQ2-9     Office Visit from 04/08/2017 in [facility] Office Visit from 11/10/2016 in  [facility] Office Visit from 09/28/2016 in [facility] Office Visit from 12/12/2015 in [facility]  PHQ-2 Total Score  0  0  1  0       Assessment and Plan: This patient is a 22 year old male with a history of depression anxiety and ADD.  He is doing well except for feeling drowsy in the mornings.  We will decrease his Paxil from 40 to 30 mg nightly.  He will continue Vyvanse 20 mg daily for ADHD and gabapentin 300 mg 3 times daily for anxiety.  He will return to see me in 3 months   Diannia Ruder, MD 02/08/2018, 9:49 AM

## 2018-05-11 ENCOUNTER — Ambulatory Visit (HOSPITAL_COMMUNITY): Payer: Medicaid Other | Admitting: Psychiatry

## 2018-05-19 ENCOUNTER — Ambulatory Visit (HOSPITAL_COMMUNITY): Payer: Medicaid Other | Admitting: Psychiatry

## 2018-05-22 ENCOUNTER — Ambulatory Visit (INDEPENDENT_AMBULATORY_CARE_PROVIDER_SITE_OTHER): Payer: Medicaid Other | Admitting: Psychiatry

## 2018-05-22 ENCOUNTER — Encounter (HOSPITAL_COMMUNITY): Payer: Self-pay | Admitting: Psychiatry

## 2018-05-22 VITALS — BP 127/75 | HR 88 | Ht 63.0 in | Wt 169.0 lb

## 2018-05-22 DIAGNOSIS — F3162 Bipolar disorder, current episode mixed, moderate: Secondary | ICD-10-CM

## 2018-05-22 DIAGNOSIS — F900 Attention-deficit hyperactivity disorder, predominantly inattentive type: Secondary | ICD-10-CM | POA: Diagnosis not present

## 2018-05-22 MED ORDER — DEXMETHYLPHENIDATE HCL 5 MG PO TABS: 5.0000 mg | ORAL_TABLET | Freq: Every day | ORAL | 0 refills | Status: DC

## 2018-05-22 MED ORDER — LISDEXAMFETAMINE DIMESYLATE 20 MG PO CAPS: 20.0000 mg | ORAL_CAPSULE | Freq: Every day | ORAL | 0 refills | Status: DC

## 2018-05-22 MED ORDER — HYDROXYZINE HCL 25 MG PO TABS: 25.0000 mg | ORAL_TABLET | Freq: Three times a day (TID) | ORAL | 2 refills | Status: DC | PRN

## 2018-05-22 MED ORDER — PAROXETINE HCL 40 MG PO TABS: 40.0000 mg | ORAL_TABLET | Freq: Every day | ORAL | 2 refills | Status: DC

## 2018-05-22 NOTE — Progress Notes (Signed)
BH MD/PA/NP OP Progress Note  05/22/2018 9:38 AM Ryan [name]  MRN:  [id number]  Chief Complaint:  Chief Complaint    Anxiety; ADHD; Follow-up     HPI: This patient is a 23 year old single white male who lives with his family in [place].  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  The patient returns with his mother's today.  He states he is generally doing well but he and a girlfriend broke up about a week ago.  They have been together for 2 years or so either his friends are is dating.  This is been rather difficult for him.  He notes that he is a lot more anxious lately and the gabapentin has "made me feel shaky."  He has stopped it.  He is sleeping well and states his mood is good and he denies suicidal ideation.  He is focusing well with the Vyvanse but he feels like he needs something later in the day because he is taking some information technology courses and he needs to study.  Last time we had cut down the Paxil because he was drowsy but he thinks we need to increase it again given his current situation. Visit Diagnosis:    ICD-10-CM   1. Attention deficit hyperactivity disorder (ADHD), predominantly inattentive type F90.0   2. Bipolar 1 disorder, mixed, moderate (HCC) F31.62     Past Psychiatric History: long term outpatient treatment for ADHD anxiety and possible bipolar disorder  Past Medical History:  Past Medical History:  Diagnosis Date  . Anxiety   . Bipolar affective disorder (HCC)   . Depression   . Seizures (HCC)    seizures began in NOV 2013    Past Surgical History:  Procedure Laterality Date  . TONSILLECTOMY      Family Psychiatric History: See below  Family History:  Family History  Problem Relation Age of Onset  . ADD / ADHD Father   . Mood Disorder Father   . ADD / ADHD Sister   . Mood Disorder Sister   . Bipolar disorder Sister   . ADD / ADHD Brother   . Mood Disorder Brother   . Bipolar disorder  Brother   . Depression Maternal Grandfather   . Mood Disorder Paternal Grandfather   . Mood Disorder Paternal Grandmother   . ADD / ADHD Sister   . Mood Disorder Sister   . Bipolar disorder Sister   . ADD / ADHD Brother   . Mood Disorder Brother   . Bipolar disorder Brother     Social History:  Social History   Socioeconomic History  . Marital status: Single    Spouse name: Not on file  . Number of children: Not on file  . Years of education: Not on file  . Highest education level: Not on file  Occupational History  . Not on file  Social Needs  . Financial resource strain: Not on file  . Food insecurity:    Worry: Not on file    Inability: Not on file  . Transportation needs:    Medical: Not on file    Non-medical: Not on file  Tobacco Use  . Smoking status: Never Smoker  . Smokeless tobacco: Never Used  Substance and Sexual Activity  . Alcohol use: No    Alcohol/week: 0.0 standard drinks    Comment: 12-17-2015 per pt no   . Drug use: No    Comment: 12-17-15 per pt no   .  Sexual activity: Never  Lifestyle  . Physical activity:    Days per week: Not on file    Minutes per session: Not on file  . Stress: Not on file  Relationships  . Social connections:    Talks on phone: Not on file    Gets together: Not on file    Attends religious service: Not on file    Active member of club or organization: Not on file    Attends meetings of clubs or organizations: Not on file    Relationship status: Not on file  Other Topics Concern  . Not on file  Social History Narrative  . Not on file    Allergies:  Allergies  Allergen Reactions  . Sulfa Antibiotics Rash    Metabolic Disorder Labs: No results found for: HGBA1C, MPG No results found for: PROLACTIN No results found for: CHOL, TRIG, HDL, CHOLHDL, VLDL, LDLCALC No results found for: TSH  Therapeutic Level Labs: No results found for: LITHIUM Lab Results  Component Value Date   VALPROATE 106 (H) 05/18/2013    VALPROATE 64.9 03/10/2012   No components found for:  CBMZ  Current Medications: Current Outpatient Medications  Medication Sig Dispense Refill  . amoxicillin-clavulanate (AUGMENTIN) 875-125 MG tablet Take 1 tablet by mouth 2 (two) times daily. 20 tablet 0  . dexmethylphenidate (FOCALIN) 5 MG tablet Take 1 tablet (5 mg total) by mouth daily. 30 tablet 0  . dexmethylphenidate (FOCALIN) 5 MG tablet Take 1 tablet (5 mg total) by mouth daily. 60 tablet 0  . dexmethylphenidate (FOCALIN) 5 MG tablet Take 1 tablet (5 mg total) by mouth daily. 30 tablet 0  . gabapentin (NEURONTIN) 300 MG capsule Take 1 capsule (300 mg total) by mouth 3 (three) times daily. 90 capsule 2  . hydrOXYzine (ATARAX/VISTARIL) 25 MG tablet Take 1 tablet (25 mg total) by mouth 3 (three) times daily as needed for anxiety. 90 tablet 2  . lisdexamfetamine (VYVANSE) 20 MG capsule Take 1 capsule (20 mg total) by mouth daily. 30 capsule 0  . lisdexamfetamine (VYVANSE) 20 MG capsule Take 1 capsule (20 mg total) by mouth daily. 30 capsule 0  . lisdexamfetamine (VYVANSE) 20 MG capsule Take 1 capsule (20 mg total) by mouth daily. 30 capsule 0  . PARoxetine (PAXIL) 20 MG tablet Take 1.5 tablets (30 mg total) by mouth daily. 45 tablet 2  . PARoxetine (PAXIL) 40 MG tablet Take 1 tablet (40 mg total) by mouth daily. 30 tablet 2   No current facility-administered medications for this visit.      Musculoskeletal: Strength & Muscle Tone: within normal limits Gait & Station: normal Patient leans: N/A  Psychiatric Specialty Exam: Review of Systems  Psychiatric/Behavioral: The patient is nervous/anxious.   All other systems reviewed and are negative.   Blood pressure 127/75, pulse 88, height 5\' 3"  (1.6 m), weight 169 lb (76.7 kg), SpO2 97 %.Body mass index is 29.94 kg/m.  General Appearance: Casual and Fairly Groomed  Eye Contact:  Good  Speech:  Clear and Coherent  Volume:  Normal  Mood:  Anxious  Affect:  Congruent  Thought  Process:  Goal Directed  Orientation:  Full (Time, Place, and Person)  Thought Content: Rumination   Suicidal Thoughts:  No  Homicidal Thoughts:  No  Memory:  Immediate;   Good Recent;   Good Remote;   Fair  Judgement:  Fair  Insight:  Fair  Psychomotor Activity:  Normal  Concentration:  Concentration: Good and Attention Span: Good  Recall:  Good  Fund of Knowledge: Good  Language: Good  Akathisia:  No  Handed:  Right  AIMS (if indicated): not done  Assets:  Communication Skills Desire for Improvement Physical Health Resilience Social Support Talents/Skills Vocational/Educational  ADL's:  Intact  Cognition: WNL  Sleep:  Good   Screenings: PHQ2-9     Office Visit from 04/08/2017 in [facility] Office Visit from 11/10/2016 in [facility] Office Visit from 09/28/2016 in [facility] Office Visit from 12/12/2015 in [facility]  PHQ-2 Total Score  0  0  1  0       Assessment and Plan: This patient is a 23 year old male with a history of depression anxiety and ADD.  He seems to be more anxious since the recent break-up.  He will increase Paxil back to 40 mg daily for anxiety and depression.  He will discontinue gabapentin and start hydroxyzine 25 mg 3 times daily for anxiety.  He will continue Vyvanse 20 mg every morning and add Focalin 5 mg in the afternoon for ADHD.  He will return to see me in 3 months.   Diannia Ruder, MD 05/22/2018, 9:38 AM

## 2018-05-29 DIAGNOSIS — H52223 Regular astigmatism, bilateral: Secondary | ICD-10-CM | POA: Diagnosis not present

## 2018-05-29 DIAGNOSIS — H5203 Hypermetropia, bilateral: Secondary | ICD-10-CM | POA: Diagnosis not present

## 2018-05-30 DIAGNOSIS — H5213 Myopia, bilateral: Secondary | ICD-10-CM | POA: Diagnosis not present

## 2018-06-19 DIAGNOSIS — H5203 Hypermetropia, bilateral: Secondary | ICD-10-CM | POA: Diagnosis not present

## 2018-06-19 DIAGNOSIS — H52223 Regular astigmatism, bilateral: Secondary | ICD-10-CM | POA: Diagnosis not present

## 2018-08-21 ENCOUNTER — Ambulatory Visit (HOSPITAL_COMMUNITY): Payer: Medicaid Other | Admitting: Psychiatry

## 2018-09-08 ENCOUNTER — Encounter (HOSPITAL_COMMUNITY): Payer: Self-pay | Admitting: Psychiatry

## 2018-09-08 ENCOUNTER — Other Ambulatory Visit: Payer: Self-pay

## 2018-09-08 ENCOUNTER — Ambulatory Visit (INDEPENDENT_AMBULATORY_CARE_PROVIDER_SITE_OTHER): Payer: Medicaid Other | Admitting: Psychiatry

## 2018-09-08 DIAGNOSIS — F900 Attention-deficit hyperactivity disorder, predominantly inattentive type: Secondary | ICD-10-CM | POA: Diagnosis not present

## 2018-09-08 DIAGNOSIS — F3162 Bipolar disorder, current episode mixed, moderate: Secondary | ICD-10-CM | POA: Diagnosis not present

## 2018-09-08 MED ORDER — TRAZODONE HCL 100 MG PO TABS: 100.0000 mg | ORAL_TABLET | Freq: Every day | ORAL | 2 refills | Status: DC

## 2018-09-08 MED ORDER — HYDROXYZINE HCL 25 MG PO TABS: 25.0000 mg | ORAL_TABLET | Freq: Three times a day (TID) | ORAL | 2 refills | Status: DC | PRN

## 2018-09-08 MED ORDER — PAROXETINE HCL 40 MG PO TABS: 40.0000 mg | ORAL_TABLET | Freq: Every day | ORAL | 2 refills | Status: DC

## 2018-09-08 MED ORDER — AMPHETAMINE-DEXTROAMPHETAMINE 10 MG PO TABS: 10.0000 mg | ORAL_TABLET | Freq: Every day | ORAL | 0 refills | Status: DC

## 2018-09-08 MED ORDER — LISDEXAMFETAMINE DIMESYLATE 40 MG PO CAPS: 40.0000 mg | ORAL_CAPSULE | ORAL | 0 refills | Status: DC

## 2018-09-08 NOTE — Progress Notes (Signed)
Virtual Visit via Video Note  I connected with Ryan [name] on 09/08/18 at  9:20 AM EDT by a video enabled telemedicine application and verified that I am speaking with the correct person using two identifiers.   I discussed the limitations of evaluation and management by telemedicine and the availability of in person appointments. The patient expressed understanding and agreed to proceed.      I discussed the assessment and treatment plan with the patient. The patient was provided an opportunity to ask questions and all were answered. The patient agreed with the plan and demonstrated an understanding of the instructions.   The patient was advised to call back or seek an in-person evaluation if the symptoms worsen or if the condition fails to improve as anticipated.  I provided15 minutes of non-face-to-face time during this encounter.   Diannia Ruder, MD  [facility] MD/PA/NP OP Progress Note  09/08/2018 9:59 AM Ryan [name]  MRN:  [id number]  Chief Complaint:  Chief Complaint    ADD; Depression; Anxiety; Follow-up     HPI: This patient is a 23 year old single white male who lives with his family in [place].  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  The patient returns for follow-up after 3 months.  He states that overall he is doing fairly well.  He spending a lot of hours working and also doing extra studying on his own to get certifications in various computer technologies.  He is spending upwards of 10 hours a day online.  He states that he is not focusing well with the Vyvanse 20 and has had to double it to 40.  He also thinks he needs something later in the day such as Adderall to help him stay focused.  Last time we tried Focalin and it made him more irritable so he would like to go back to Adderall.  He is tolerating the Paxil 40 mg well and denies any depressive symptoms or mood swings.  The hydroxyzine helps him with his anxiety.  At  times he has a little trouble slowing down and going to sleep and would like to restart trazodone.  I told him that he needs to get up every hour and move around and also take a lunch break.  These things will help with his sleep and mood and he agrees.  He denies suicidal ideation. Visit Diagnosis:    ICD-10-CM   1. Attention deficit hyperactivity disorder (ADHD), predominantly inattentive type F90.0   2. Bipolar 1 disorder, mixed, moderate (HCC) F31.62     Past Psychiatric History: Long-term outpatient treatment for ADHD, anxiety and possible bipolar disorder  Past Medical History:  Past Medical History:  Diagnosis Date  . Anxiety   . Bipolar affective disorder (HCC)   . Depression   . Seizures (HCC)    seizures began in NOV 2013    Past Surgical History:  Procedure Laterality Date  . TONSILLECTOMY      Family Psychiatric History: See below  Family History:  Family History  Problem Relation Age of Onset  . ADD / ADHD Father   . Mood Disorder Father   . ADD / ADHD Sister   . Mood Disorder Sister   . Bipolar disorder Sister   . ADD / ADHD Brother   . Mood Disorder Brother   . Bipolar disorder Brother   . Depression Maternal Grandfather   . Mood Disorder Paternal Grandfather   . Mood Disorder Paternal Grandmother   .  ADD / ADHD Sister   . Mood Disorder Sister   . Bipolar disorder Sister   . ADD / ADHD Brother   . Mood Disorder Brother   . Bipolar disorder Brother     Social History:  Social History   Socioeconomic History  . Marital status: Single    Spouse name: Not on file  . Number of children: Not on file  . Years of education: Not on file  . Highest education level: Not on file  Occupational History  . Not on file  Social Needs  . Financial resource strain: Not on file  . Food insecurity:    Worry: Not on file    Inability: Not on file  . Transportation needs:    Medical: Not on file    Non-medical: Not on file  Tobacco Use  . Smoking status:  Never Smoker  . Smokeless tobacco: Never Used  Substance and Sexual Activity  . Alcohol use: No    Alcohol/week: 0.0 standard drinks    Comment: 12-17-2015 per pt no   . Drug use: No    Comment: 12-17-15 per pt no   . Sexual activity: Never  Lifestyle  . Physical activity:    Days per week: Not on file    Minutes per session: Not on file  . Stress: Not on file  Relationships  . Social connections:    Talks on phone: Not on file    Gets together: Not on file    Attends religious service: Not on file    Active member of club or organization: Not on file    Attends meetings of clubs or organizations: Not on file    Relationship status: Not on file  Other Topics Concern  . Not on file  Social History Narrative  . Not on file    Allergies:  Allergies  Allergen Reactions  . Sulfa Antibiotics Rash    Metabolic Disorder Labs: No results found for: HGBA1C, MPG No results found for: PROLACTIN No results found for: CHOL, TRIG, HDL, CHOLHDL, VLDL, LDLCALC No results found for: TSH  Therapeutic Level Labs: No results found for: LITHIUM Lab Results  Component Value Date   VALPROATE 106 (H) 05/18/2013   VALPROATE 64.9 03/10/2012   No components found for:  CBMZ  Current Medications: Current Outpatient Medications  Medication Sig Dispense Refill  . amoxicillin-clavulanate (AUGMENTIN) 875-125 MG tablet Take 1 tablet by mouth 2 (two) times daily. 20 tablet 0  . amphetamine-dextroamphetamine (ADDERALL) 10 MG tablet Take 1 tablet (10 mg total) by mouth daily. 30 tablet 0  . amphetamine-dextroamphetamine (ADDERALL) 10 MG tablet Take 1 tablet (10 mg total) by mouth daily. 30 tablet 0  . amphetamine-dextroamphetamine (ADDERALL) 10 MG tablet Take 1 tablet (10 mg total) by mouth daily. 30 tablet 0  . hydrOXYzine (ATARAX/VISTARIL) 25 MG tablet Take 1 tablet (25 mg total) by mouth 3 (three) times daily as needed for anxiety. 90 tablet 2  . lisdexamfetamine (VYVANSE) 40 MG capsule Take 1  capsule (40 mg total) by mouth every morning. 30 capsule 0  . lisdexamfetamine (VYVANSE) 40 MG capsule Take 1 capsule (40 mg total) by mouth every morning. 30 capsule 0  . lisdexamfetamine (VYVANSE) 40 MG capsule Take 1 capsule (40 mg total) by mouth every morning. 30 capsule 0  . PARoxetine (PAXIL) 40 MG tablet Take 1 tablet (40 mg total) by mouth daily. 30 tablet 2  . traZODone (DESYREL) 100 MG tablet Take 1 tablet (100 mg total) by  mouth at bedtime. 30 tablet 2   No current facility-administered medications for this visit.      Musculoskeletal: Strength & Muscle Tone: within normal limits Gait & Station: normal Patient leans: N/A  Psychiatric Specialty Exam: Review of Systems  All other systems reviewed and are negative.   There were no vitals taken for this visit.There is no height or weight on file to calculate BMI.  General Appearance: Casual and Fairly Groomed  Eye Contact:  Good  Speech:  Clear and Coherent  Volume:  Normal  Mood:  Euthymic  Affect:  Appropriate and Congruent  Thought Process:  Goal Directed  Orientation:  Full (Time, Place, and Person)  Thought Content: WDL   Suicidal Thoughts:  No  Homicidal Thoughts:  No  Memory:  Immediate;   Good Recent;   Good Remote;   Good  Judgement:  Good  Insight:  Good  Psychomotor Activity:  Normal  Concentration:  Concentration: Fair and Attention Span: Fair  Recall:  Good  Fund of Knowledge: Good  Language: Good  Akathisia:  No  Handed:  Right  AIMS (if indicated): not done  Assets:  Communication Skills Desire for Improvement Physical Health Resilience Social Support Talents/Skills Vocational/Educational  ADL's:  Intact  Cognition: WNL  Sleep:  Fair   Screenings: PHQ2-9     [facility]08/2017 [facility] Visit from 11/10/2016 in [facility] Visit from 09/28/2016 in [facility] 12[facility]12/2015 in  [facility]  PHQ-2 Total Score  0  0  1  0       Assessment and Plan:  This patient is a 23 year old male with a history of possible bipolar disorder but definitely depression and anxiety and ADHD.  He does need an increase in Vyvanse to help with focus.  He will start Vyvanse 40 mg daily for focus as well as Adderall 10 mg later in the day, trazodone 100 mg at bedtime for sleep, Paxil 40 mg daily for depression and hydroxyzine 25 mg 3 times daily as needed for anxiety.  He will return to see me in 3 months  Diannia Rudereborah , MD 09/08/2018, 9:59 AM

## 2018-09-11 ENCOUNTER — Other Ambulatory Visit: Payer: Self-pay

## 2018-10-27 ENCOUNTER — Ambulatory Visit (INDEPENDENT_AMBULATORY_CARE_PROVIDER_SITE_OTHER): Payer: Medicaid Other | Admitting: Psychiatry

## 2018-10-27 ENCOUNTER — Encounter (HOSPITAL_COMMUNITY): Payer: Self-pay | Admitting: Psychiatry

## 2018-10-27 ENCOUNTER — Other Ambulatory Visit: Payer: Self-pay

## 2018-10-27 DIAGNOSIS — F900 Attention-deficit hyperactivity disorder, predominantly inattentive type: Secondary | ICD-10-CM

## 2018-10-27 DIAGNOSIS — F3162 Bipolar disorder, current episode mixed, moderate: Secondary | ICD-10-CM

## 2018-10-27 MED ORDER — AMPHETAMINE-DEXTROAMPHETAMINE 10 MG PO TABS: 10.0000 mg | ORAL_TABLET | Freq: Every day | ORAL | 0 refills | Status: DC

## 2018-10-27 MED ORDER — LISDEXAMFETAMINE DIMESYLATE 60 MG PO CAPS: 60.0000 mg | ORAL_CAPSULE | ORAL | 0 refills | Status: DC

## 2018-10-27 MED ORDER — TRAZODONE HCL 100 MG PO TABS: 100.0000 mg | ORAL_TABLET | Freq: Every day | ORAL | 2 refills | Status: DC

## 2018-10-27 MED ORDER — PAROXETINE HCL 40 MG PO TABS: 40.0000 mg | ORAL_TABLET | Freq: Every day | ORAL | 2 refills | Status: DC

## 2018-10-27 MED ORDER — HYDROXYZINE PAMOATE 100 MG PO CAPS: 100.0000 mg | ORAL_CAPSULE | Freq: Two times a day (BID) | ORAL | 2 refills | Status: DC

## 2018-10-27 NOTE — Progress Notes (Signed)
Virtual Visit via Video Note  I connected with Ryan [name] on 10/27/18 at  8:20 AM EDT by a video enabled telemedicine application and verified that I am speaking with the correct person using two identifiers.   I discussed the limitations of evaluation and management by telemedicine and the availability of in person appointments. The patient expressed understanding and agreed to proceed.     I discussed the assessment and treatment plan with the patient. The patient was provided an opportunity to ask questions and all were answered. The patient agreed with the plan and demonstrated an understanding of the instructions.   The patient was advised to call back or seek an in-person evaluation if the symptoms worsen or if the condition fails to improve as anticipated.  I provided 15 minutes of non-face-to-face time during this encounter.   [name], MD  [facility] MD/PA/NP OP Progress Note  10/27/2018 8:50 AM Ryan [name]  MRN:  [id number]  Chief Complaint:  Chief Complaint    Depression; Anxiety; ADHD; Follow-up     HPI: This patient is a 23 year old single white male who lives with his family in [place].  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  The patient returns after 6 weeks.  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  Last time we increased his Vyvanse but he is still not staying focused through the day on the 40 mg and he does not like adding the Adderall because it gives him a "crash on "feeling 1 it wears off.  I suggested we go up to Vyvanse 60 mg and still have the Adderall available at the end of the day in case he needs it.  He also states that he has been very anxious about the coronavirus and other things going on in the world.  He has had to increase the hydroxyzine to 75 mg to alleviate  anxiety and he takes this twice a day.  I suggested that we go to the 100 mg twice a day as it does not make him at all drowsy.  The Paxil is helping with his depression and trazodone with sleep.  He denies any thoughts of self-harm. Visit Diagnosis:    ICD-10-CM   1. Attention deficit hyperactivity disorder (ADHD), predominantly inattentive type  F90.0   2. Bipolar 1 disorder, mixed, moderate (HCC)  F31.62     Past Psychiatric History: Long-term outpatient treatment  Past Medical History:  Past Medical History:  Diagnosis Date  . Anxiety   . Bipolar affective disorder ([place])   . Depression   . Seizures ([place])    seizures began in NOV 2013    Past Surgical History:  Procedure Laterality Date  . TONSILLECTOMY      Family Psychiatric History: See below  Family History:  Family History  Problem Relation Age of Onset  . ADD / ADHD Father   . Mood Disorder Father   . ADD / ADHD Sister   . Mood Disorder Sister   . Bipolar disorder Sister   . ADD / ADHD Brother   . Mood Disorder Brother   . Bipolar disorder Brother   . Depression Maternal Grandfather   . Mood Disorder Paternal Grandfather   . Mood Disorder Paternal Grandmother   . ADD / ADHD Sister   .  Mood Disorder Sister   . Bipolar disorder Sister   . ADD / ADHD Brother   . Mood Disorder Brother   . Bipolar disorder Brother     Social History:  Social History   Socioeconomic History  . Marital status: Single    Spouse name: Not on file  . Number of children: Not on file  . Years of education: Not on file  . Highest education level: Not on file  Occupational History  . Not on file  Social Needs  . Financial resource strain: Not on file  . Food insecurity    Worry: Not on file    Inability: Not on file  . Transportation needs    Medical: Not on file    Non-medical: Not on file  Tobacco Use  . Smoking status: Never Smoker  . Smokeless tobacco: Never Used  Substance and Sexual Activity  . Alcohol use: No     Alcohol/week: 0.0 standard drinks    Comment: 12-17-2015 per pt no   . Drug use: No    Comment: 12-17-15 per pt no   . Sexual activity: Never  Lifestyle  . Physical activity    Days per week: Not on file    Minutes per session: Not on file  . Stress: Not on file  Relationships  . Social Musicianconnections    Talks on phone: Not on file    Gets together: Not on file    Attends religious service: Not on file    Active member of club or organization: Not on file    Attends meetings of clubs or organizations: Not on file    Relationship status: Not on file  Other Topics Concern  . Not on file  Social History Narrative  . Not on file    Allergies:  Allergies  Allergen Reactions  . Sulfa Antibiotics Rash    Metabolic Disorder Labs: No results found for: HGBA1C, MPG No results found for: PROLACTIN No results found for: CHOL, TRIG, HDL, CHOLHDL, VLDL, LDLCALC No results found for: TSH  Therapeutic Level Labs: No results found for: LITHIUM Lab Results  Component Value Date   VALPROATE 106 (H) 05/18/2013   VALPROATE 64.9 03/10/2012   No components found for:  CBMZ  Current Medications: Current Outpatient Medications  Medication Sig Dispense Refill  . amoxicillin-clavulanate (AUGMENTIN) 875-125 MG tablet Take 1 tablet by mouth 2 (two) times daily. 20 tablet 0  . amphetamine-dextroamphetamine (ADDERALL) 10 MG tablet Take 1 tablet (10 mg total) by mouth daily. 30 tablet 0  . amphetamine-dextroamphetamine (ADDERALL) 10 MG tablet Take 1 tablet (10 mg total) by mouth daily. 30 tablet 0  . amphetamine-dextroamphetamine (ADDERALL) 10 MG tablet Take 1 tablet (10 mg total) by mouth daily. 30 tablet 0  . hydrOXYzine (VISTARIL) 100 MG capsule Take 1 capsule (100 mg total) by mouth 2 (two) times a day. 60 capsule 2  . lisdexamfetamine (VYVANSE) 60 MG capsule Take 1 capsule (60 mg total) by mouth every morning. 30 capsule 0  . lisdexamfetamine (VYVANSE) 60 MG capsule Take 1 capsule (60 mg  total) by mouth every morning. 30 capsule 0  . lisdexamfetamine (VYVANSE) 60 MG capsule Take 1 capsule (60 mg total) by mouth every morning. 30 capsule 0  . PARoxetine (PAXIL) 40 MG tablet Take 1 tablet (40 mg total) by mouth daily. 30 tablet 2  . traZODone (DESYREL) 100 MG tablet Take 1 tablet (100 mg total) by mouth at bedtime. 30 tablet 2   No current  facility-administered medications for this visit.      Musculoskeletal: Strength & Muscle Tone: within normal limits Gait & Station: normal Patient leans: N/A  Psychiatric Specialty Exam: Review of Systems  Psychiatric/Behavioral: The patient is nervous/anxious.   All other systems reviewed and are negative.   There were no vitals taken for this visit.There is no height or weight on file to calculate BMI.  General Appearance: Casual and Fairly Groomed  Eye Contact:  Good  Speech:  Clear and Coherent  Volume:  Normal  Mood:  Anxious  Affect:  Appropriate and Congruent  Thought Process:  Goal Directed  Orientation:  Full (Time, Place, and Person)  Thought Content: Rumination   Suicidal Thoughts:  No  Homicidal Thoughts:  No  Memory:  Immediate;   Good Recent;   Good Remote;   Good  Judgement:  Good  Insight:  Good  Psychomotor Activity:  Normal  Concentration:  Concentration: Fair and Attention Span: Fair  Recall:  Good  Fund of Knowledge: Good  Language: Good  Akathisia:  No  Handed:  Right  AIMS (if indicated): not done  Assets:  Communication Skills Desire for Improvement Physical Health Resilience Social Support Talents/Skills  ADL's:  Intact  Cognition: WNL  Sleep:  Good   Screenings: PHQ2-9     Office Visit from 04/08/2017 in [facility] Office Visit from [facility]/10/2016 in [facility] Visit from 09/28/2016 in [facility] Visit from 12/12/2015 in [facility]  PHQ-2 Total Score  0  0  1  0       Assessment  and Plan:  This patient is a 23 year old male with a history of depression anxiety and ADHD, possible bipolar disorder.  He will increase Vyvanse to 60 mg every morning to help with ADHD and can still use Adderall 10 mg the end of the day if needed.  He will increase Vistaril to 100 mg twice daily for anxiety.  He will continue Paxil 40 mg daily for depression and trazodone 100 mg at bedtime for sleep.  He will return to see me in 2 months  Diannia Rudereborah , MD 10/27/2018, 8:50 AM

## 2018-12-07 ENCOUNTER — Other Ambulatory Visit: Payer: Self-pay

## 2018-12-07 ENCOUNTER — Ambulatory Visit (INDEPENDENT_AMBULATORY_CARE_PROVIDER_SITE_OTHER): Payer: Medicaid Other | Admitting: Psychiatry

## 2018-12-07 ENCOUNTER — Telehealth (HOSPITAL_COMMUNITY): Payer: Self-pay | Admitting: *Deleted

## 2018-12-07 ENCOUNTER — Other Ambulatory Visit (HOSPITAL_COMMUNITY): Payer: Self-pay | Admitting: Psychiatry

## 2018-12-07 ENCOUNTER — Encounter (HOSPITAL_COMMUNITY): Payer: Self-pay | Admitting: Psychiatry

## 2018-12-07 DIAGNOSIS — F900 Attention-deficit hyperactivity disorder, predominantly inattentive type: Secondary | ICD-10-CM | POA: Diagnosis not present

## 2018-12-07 DIAGNOSIS — F3162 Bipolar disorder, current episode mixed, moderate: Secondary | ICD-10-CM | POA: Diagnosis not present

## 2018-12-07 MED ORDER — TRAZODONE HCL 100 MG PO TABS: 100.0000 mg | ORAL_TABLET | Freq: Every day | ORAL | 2 refills | Status: DC

## 2018-12-07 MED ORDER — LISDEXAMFETAMINE DIMESYLATE 60 MG PO CAPS: 60.0000 mg | ORAL_CAPSULE | ORAL | 0 refills | Status: DC

## 2018-12-07 MED ORDER — PAROXETINE HCL 40 MG PO TABS: 40.0000 mg | ORAL_TABLET | Freq: Every day | ORAL | 2 refills | Status: DC

## 2018-12-07 MED ORDER — HYDROXYZINE PAMOATE 100 MG PO CAPS: 100.0000 mg | ORAL_CAPSULE | Freq: Two times a day (BID) | ORAL | 2 refills | Status: DC

## 2018-12-07 MED ORDER — AMPHETAMINE-DEXTROAMPHETAMINE 10 MG PO TABS: 10.0000 mg | ORAL_TABLET | Freq: Every day | ORAL | 0 refills | Status: DC

## 2018-12-07 NOTE — Telephone Encounter (Signed)
MOM CALLED STATING THAT REFILL Rx HAS BEEN SENT TO WRONG Rx & ASKED IF THEY COULD BE RESENT? RX PREFERENCE HAS BEEN UPDATED IN [facility]

## 2018-12-07 NOTE — Progress Notes (Signed)
Virtual Visit via Video Note  I connected with Ryan [name] on 12/07/18 at 10:40 AM EDT by a video enabled telemedicine application and verified that I am speaking with the correct person using two identifiers.   I discussed the limitations of evaluation and management by telemedicine and the availability of in person appointments. The patient expressed understanding and agreed to proceed.    I discussed the assessment and treatment plan with the patient. The patient was provided an opportunity to ask questions and all were answered. The patient agreed with the plan and demonstrated an understanding of the instructions.   The patient was advised to call back or seek an in-person evaluation if the symptoms worsen or if the condition fails to improve as anticipated.  I provided 15 minutes of non-face-to-face time during this encounter.   Diannia Rudereborah Tywanna Seifer, MD  [facility] MD/PA/NP OP Progress Note  12/07/2018 11:01 AM Ryan [name]  MRN:  [id number]  Chief Complaint:  Chief Complaint    Depression; ADHD; Follow-up     HPI: This patient is a 23 year old single white male who lives with his family in [place].  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  The patient returns after 2 months.  For the most part he is doing well.  He is working at home because his sister was exposed to coronavirus and they are quarantined for 2 weeks.  Last time he was having trouble focusing so we increase the Vyvanse to 60 mg every morning and this seems to be working better for him.  He is also studying coding and feels like he is "reached a plateau" and has a hard time staying motivated.  I explained that this is common in a long course of study.  He states that he is feeling better since we increased hydroxyzine in terms of anxiety.  The Paxil is helping with his depression and trazodone with sleep.  He denies significant depression anxiety or suicidal ideation. Visit  Diagnosis:    ICD-10-CM   1. Attention deficit hyperactivity disorder (ADHD), predominantly inattentive type  F90.0   2. Bipolar 1 disorder, mixed, moderate (HCC)  F31.62     Past Psychiatric History: Long-term outpatient treatment  Past Medical History:  Past Medical History:  Diagnosis Date  . Anxiety   . Bipolar affective disorder (HCC)   . Depression   . Seizures (HCC)    seizures began in NOV 2013    Past Surgical History:  Procedure Laterality Date  . TONSILLECTOMY      Family Psychiatric History: See below  Family History:  Family History  Problem Relation Age of Onset  . ADD / ADHD Father   . Mood Disorder Father   . ADD / ADHD Sister   . Mood Disorder Sister   . Bipolar disorder Sister   . ADD / ADHD Brother   . Mood Disorder Brother   . Bipolar disorder Brother   . Depression Maternal Grandfather   . Mood Disorder Paternal Grandfather   . Mood Disorder Paternal Grandmother   . ADD / ADHD Sister   . Mood Disorder Sister   . Bipolar disorder Sister   . ADD / ADHD Brother   . Mood Disorder Brother   . Bipolar disorder Brother     Social History:  Social History   Socioeconomic History  . Marital status: Single    Spouse name: Not on file  . Number of children: Not on file  .  Years of education: Not on file  . Highest education level: Not on file  Occupational History  . Not on file  Social Needs  . Financial resource strain: Not on file  . Food insecurity    Worry: Not on file    Inability: Not on file  . Transportation needs    Medical: Not on file    Non-medical: Not on file  Tobacco Use  . Smoking status: Never Smoker  . Smokeless tobacco: Never Used  Substance and Sexual Activity  . Alcohol use: No    Alcohol/week: 0.0 standard drinks    Comment: 12-17-2015 per pt no   . Drug use: No    Comment: 12-17-15 per pt no   . Sexual activity: Never  Lifestyle  . Physical activity    Days per week: Not on file    Minutes per session: Not  on file  . Stress: Not on file  Relationships  . Social Musicianconnections    Talks on phone: Not on file    Gets together: Not on file    Attends religious service: Not on file    Active member of club or organization: Not on file    Attends meetings of clubs or organizations: Not on file    Relationship status: Not on file  Other Topics Concern  . Not on file  Social History Narrative  . Not on file    Allergies:  Allergies  Allergen Reactions  . Sulfa Antibiotics Rash    Metabolic Disorder Labs: No results found for: HGBA1C, MPG No results found for: PROLACTIN No results found for: CHOL, TRIG, HDL, CHOLHDL, VLDL, LDLCALC No results found for: TSH  Therapeutic Level Labs: No results found for: LITHIUM Lab Results  Component Value Date   VALPROATE 106 (H) 05/18/2013   VALPROATE 64.9 03/10/2012   No components found for:  CBMZ  Current Medications: Current Outpatient Medications  Medication Sig Dispense Refill  . amoxicillin-clavulanate (AUGMENTIN) 875-125 MG tablet Take 1 tablet by mouth 2 (two) times daily. 20 tablet 0  . amphetamine-dextroamphetamine (ADDERALL) 10 MG tablet Take 1 tablet (10 mg total) by mouth daily. 30 tablet 0  . amphetamine-dextroamphetamine (ADDERALL) 10 MG tablet Take 1 tablet (10 mg total) by mouth daily. 30 tablet 0  . amphetamine-dextroamphetamine (ADDERALL) 10 MG tablet Take 1 tablet (10 mg total) by mouth daily. 30 tablet 0  . hydrOXYzine (VISTARIL) 100 MG capsule Take 1 capsule (100 mg total) by mouth 2 (two) times daily. 60 capsule 2  . lisdexamfetamine (VYVANSE) 60 MG capsule Take 1 capsule (60 mg total) by mouth every morning. 30 capsule 0  . lisdexamfetamine (VYVANSE) 60 MG capsule Take 1 capsule (60 mg total) by mouth every morning. 30 capsule 0  . lisdexamfetamine (VYVANSE) 60 MG capsule Take 1 capsule (60 mg total) by mouth every morning. 30 capsule 0  . PARoxetine (PAXIL) 40 MG tablet Take 1 tablet (40 mg total) by mouth daily. 30 tablet  2  . traZODone (DESYREL) 100 MG tablet Take 1 tablet (100 mg total) by mouth at bedtime. 30 tablet 2   No current facility-administered medications for this visit.      Musculoskeletal: Strength & Muscle Tone: within normal limits Gait & Station: normal Patient leans: N/A  Psychiatric Specialty Exam: Review of Systems  All other systems reviewed and are negative.   There were no vitals taken for this visit.There is no height or weight on file to calculate BMI.  General Appearance: Casual and  Fairly Groomed  Eye Contact:  Good  Speech:  Clear and Coherent  Volume:  Normal  Mood:  Euthymic  Affect:  Appropriate and Congruent  Thought Process:  Goal Directed  Orientation:  Full (Time, Place, and Person)  Thought Content: WDL   Suicidal Thoughts:  No  Homicidal Thoughts:  No  Memory:  Immediate;   Good Recent;   Good Remote;   Good  Judgement:  Good  Insight:  Good  Psychomotor Activity:  Normal  Concentration:  Concentration: Good and Attention Span: Good  Recall:  Good  Fund of Knowledge: Good  Language: Good  Akathisia:  No  Handed:  Right  AIMS (if indicated): not done  Assets:  Communication Skills Desire for Improvement Physical Health Resilience Social Support Talents/Skills  ADL's:  Intact  Cognition: WNL  Sleep:  Good   Screenings: PHQ2-9     Office Visit from 04/08/2017 in [facility] Visit from 11/10/2016 in [facility] Office Visit from 09/28/2016 in [facility] Visit from 12/12/2015 in [facility]  PHQ-2 Total Score  0  0  1  0       Assessment and Plan:  This patient is a 23 year old male with a history of depression anxiety ADHD and possible bipolar disorder.  He is currently doing well in all areas.  He will continue Vyvanse 60 mg every morning and Adderall 10 mg at the end of the day for ADHD.  He will continue Vistaril 100 mg twice daily for  anxiety, Paxil 40 mg daily for depression and trazodone 100 mg at bedtime for sleep.  He will return to see me in 3 months  [name], MD 12/07/2018, 11:01 AM

## 2018-12-07 NOTE — Telephone Encounter (Signed)
All his meds were sent to [facility]

## 2018-12-25 ENCOUNTER — Other Ambulatory Visit: Payer: Self-pay

## 2018-12-25 ENCOUNTER — Ambulatory Visit: Payer: Medicaid Other | Admitting: Family Medicine

## 2018-12-25 ENCOUNTER — Encounter: Payer: Self-pay | Admitting: Family Medicine

## 2018-12-25 VITALS — BP 129/79 | HR 101 | Temp 98.7°F | Ht 63.0 in | Wt 175.0 lb

## 2018-12-25 DIAGNOSIS — R531 Weakness: Secondary | ICD-10-CM | POA: Diagnosis not present

## 2018-12-25 DIAGNOSIS — R61 Generalized hyperhidrosis: Secondary | ICD-10-CM

## 2018-12-25 DIAGNOSIS — R251 Tremor, unspecified: Secondary | ICD-10-CM | POA: Diagnosis not present

## 2018-12-25 LAB — URINALYSIS, COMPLETE
Bilirubin, UA: NEGATIVE
Glucose, UA: NEGATIVE
Ketones, UA: NEGATIVE
Leukocytes,UA: NEGATIVE
Nitrite, UA: NEGATIVE
Protein,UA: NEGATIVE
RBC, UA: NEGATIVE
Specific Gravity, UA: 1.02 (ref 1.005–1.030)
Urobilinogen, Ur: 0.2 mg/dL (ref 0.2–1.0)
pH, UA: 7 (ref 5.0–7.5)

## 2018-12-25 LAB — MICROSCOPIC EXAMINATION
Bacteria, UA: NONE SEEN
Epithelial Cells (non renal): NONE SEEN /hpf (ref 0–10)
RBC, Urine: NONE SEEN /hpf (ref 0–2)
Renal Epithel, UA: NONE SEEN /hpf
WBC, UA: NONE SEEN /hpf (ref 0–5)

## 2018-12-25 LAB — BAYER DCA HB A1C WAIVED: HB A1C (BAYER DCA - WAIVED): 5.3 % (ref <7.0)

## 2018-12-25 MED ORDER — BLOOD GLUCOSE METER KIT: PACK | 0 refills | Status: DC

## 2018-12-25 NOTE — Progress Notes (Signed)
Subjective:  Patient ID: Ryan [name], male    DOB: 1995/06/21, 23 y.o.   MRN: [id number]  Patient Care Team: [name], Fransisca Kaufmann, MD as PCP - General (Family Medicine)   Chief Complaint:  Fatigue (episodes of light-headed, weak and shakes)   HPI: Ryan [name] is a 23 y.o. male presenting on 12/25/2018 for Fatigue (episodes of light-headed, weak and shakes)   Pt presents today with complaints of recurrent episodes of shakiness, light headedness, general weakness, fatigue, and irritability. States this usually occurs when he has not had anything to eat or when he has been in the heat for a long period of time. Pt states once he eats or drinks something the symptoms subside. Pt denies confusion, diaphoresis, or syncope. No polyuria, polydipsia, or polyphagia. No palpitations, chest pain, shortness of breath, or visual disturbances.    Relevant past medical, surgical, family, and social history reviewed and updated as indicated.  Allergies and medications reviewed and updated. Date reviewed: Chart in Epic.   Past Medical History:  Diagnosis Date   Anxiety    Bipolar affective disorder ([place])    Depression    Seizures ([place])    seizures began in NOV 2013    Past Surgical History:  Procedure Laterality Date   TONSILLECTOMY      Social History   Socioeconomic History   Marital status: Single    Spouse name: Not on file   Number of children: Not on file   Years of education: Not on file   Highest education level: Not on file  Occupational History   Not on file  Social Needs   Financial resource strain: Not on file   Food insecurity    Worry: Not on file    Inability: Not on file   Transportation needs    Medical: Not on file    Non-medical: Not on file  Tobacco Use   Smoking status: Never Smoker   Smokeless tobacco: Never Used  Substance and Sexual Activity   Alcohol use: No    Alcohol/week: 0.0 standard drinks    Comment: 12-17-2015 per pt no     Drug use: No    Comment: 12-17-15 per pt no    Sexual activity: Never  Lifestyle   Physical activity    Days per week: Not on file    Minutes per session: Not on file   Stress: Not on file  Relationships   Social connections    Talks on phone: Not on file    Gets together: Not on file    Attends religious service: Not on file    Active member of club or organization: Not on file    Attends meetings of clubs or organizations: Not on file    Relationship status: Not on file   Intimate partner violence    Fear of current or ex partner: Not on file    Emotionally abused: Not on file    Physically abused: Not on file    Forced sexual activity: Not on file  Other Topics Concern   Not on file  Social History Narrative   Not on file    Outpatient Encounter Medications as of 12/25/2018  Medication Sig   lisdexamfetamine (VYVANSE) 60 MG capsule Take 1 capsule (60 mg total) by mouth every morning.   PARoxetine (PAXIL) 40 MG tablet Take 1 tablet (40 mg total) by mouth daily.   traZODone (DESYREL) 100 MG tablet Take 1 tablet (100 mg total) by  mouth at bedtime.   [DISCONTINUED] hydrOXYzine (VISTARIL) 100 MG capsule Take 1 capsule (100 mg total) by mouth 2 (two) times daily.   blood glucose meter kit and supplies Dispense based on patient and insurance preference. Use up to four times daily as directed. (FOR ICD-10 E10.9, E11.9).   lisdexamfetamine (VYVANSE) 60 MG capsule Take 1 capsule (60 mg total) by mouth every morning. (Patient not taking: Reported on 12/25/2018)   lisdexamfetamine (VYVANSE) 60 MG capsule Take 1 capsule (60 mg total) by mouth every morning. (Patient not taking: Reported on 12/25/2018)   [DISCONTINUED] amoxicillin-clavulanate (AUGMENTIN) 875-125 MG tablet Take 1 tablet by mouth 2 (two) times daily.   [DISCONTINUED] amphetamine-dextroamphetamine (ADDERALL) 10 MG tablet Take 1 tablet (10 mg total) by mouth daily.   [DISCONTINUED] amphetamine-dextroamphetamine  (ADDERALL) 10 MG tablet Take 1 tablet (10 mg total) by mouth daily.   [DISCONTINUED] amphetamine-dextroamphetamine (ADDERALL) 10 MG tablet Take 1 tablet (10 mg total) by mouth daily.   No facility-administered encounter medications on file as of 12/25/2018.     Allergies  Allergen Reactions   Sulfa Antibiotics Rash    Review of Systems  Constitutional: Positive for fatigue. Negative for activity change, appetite change, chills, diaphoresis, fever and unexpected weight change.  Eyes: Negative for photophobia and visual disturbance.  Respiratory: Negative for cough, chest tightness, shortness of breath and wheezing.   Cardiovascular: Negative for chest pain, palpitations and leg swelling.  Gastrointestinal: Negative for abdominal distention, abdominal pain, anal bleeding, blood in stool, constipation, diarrhea, nausea, rectal pain and vomiting.  Endocrine: Negative for cold intolerance, heat intolerance, polydipsia, polyphagia and polyuria.  Genitourinary: Negative for decreased urine volume, difficulty urinating and hematuria.  Musculoskeletal: Negative for arthralgias and myalgias.  Skin: Negative for color change and pallor.  Neurological: Positive for tremors, weakness (generalized ) and light-headedness. Negative for dizziness, seizures, syncope, facial asymmetry, speech difficulty, numbness and headaches.  Hematological: Negative for adenopathy. Does not bruise/bleed easily.  Psychiatric/Behavioral: Positive for agitation. Negative for confusion and decreased concentration.  All other systems reviewed and are negative.       Objective:  BP 129/79    Pulse (!) 101    Temp 98.7 F (37.1 C)    Ht _0  (1.6 m)    Wt 175 lb (79.4 kg)    BMI 31.00 kg/m    Wt Readings from Last 3 Encounters:  12/25/18 175 lb (79.4 kg)  04/08/17 158 lb (71.7 kg) (56 %, Z= 0.15)*  09/28/16 157 lb (71.2 kg) (57 %, Z= 0.18)*   * Growth percentiles are based on CDC (Boys, 2-20 Years) data.     Physical Exam Vitals signs and nursing note reviewed.  Constitutional:      General: He is not in acute distress.    Appearance: Normal appearance. He is well-developed and well-groomed. He is not ill-appearing, toxic-appearing or diaphoretic.  HENT:     Head: Normocephalic and atraumatic.     Jaw: There is normal jaw occlusion.     Right Ear: Hearing normal.     Left Ear: Hearing normal.     Nose: Nose normal.     Mouth/Throat:     Lips: Pink.     Mouth: Mucous membranes are moist.     Pharynx: Oropharynx is clear. Uvula midline.  Eyes:     General: Lids are normal.     Extraocular Movements: Extraocular movements intact.     Conjunctiva/sclera: Conjunctivae normal.     Pupils: Pupils are equal, round, and reactive  to light.  Neck:     Musculoskeletal: Normal range of motion and neck supple.     Thyroid: No thyroid mass, thyromegaly or thyroid tenderness.     Vascular: No carotid bruit or JVD.     Trachea: Trachea and phonation normal.  Cardiovascular:     Rate and Rhythm: Normal rate and regular rhythm.     Chest Wall: PMI is not displaced.     Pulses: Normal pulses.     Heart sounds: Normal heart sounds. No murmur. No friction rub. No gallop.   Pulmonary:     Effort: Pulmonary effort is normal. No respiratory distress.     Breath sounds: Normal breath sounds. No wheezing.  Abdominal:     General: Bowel sounds are normal. There is no distension or abdominal bruit.     Palpations: Abdomen is soft. There is no hepatomegaly or splenomegaly.     Tenderness: There is no abdominal tenderness. There is no right CVA tenderness or left CVA tenderness.     Hernia: No hernia is present.  Musculoskeletal: Normal range of motion.     Right lower leg: No edema.     Left lower leg: No edema.  Lymphadenopathy:     Cervical: No cervical adenopathy.  Skin:    General: Skin is warm and dry.     Capillary Refill: Capillary refill takes less than 2 seconds.     Coloration: Skin is  not cyanotic, jaundiced or pale.     Findings: No rash.  Neurological:     General: No focal deficit present.     Mental Status: He is alert and oriented to person, place, and time.     Cranial Nerves: Cranial nerves are intact. No cranial nerve deficit.     Sensory: Sensation is intact. No sensory deficit.     Motor: Motor function is intact. No weakness.     Coordination: Coordination is intact. Coordination normal.     Gait: Gait is intact. Gait normal.     Deep Tendon Reflexes: Reflexes are normal and symmetric. Reflexes normal.  Psychiatric:        Attention and Perception: Attention and perception normal.        Mood and Affect: Mood and affect normal.        Speech: Speech normal.        Behavior: Behavior normal. Behavior is cooperative.        Thought Content: Thought content normal.        Cognition and Memory: Cognition and memory normal.        Judgment: Judgment normal.     Results for orders placed or performed in visit on 12/25/18  Microscopic Examination   URINE  Result Value Ref Range   WBC, UA None seen 0 - 5 /hpf   RBC None seen 0 - 2 /hpf   Epithelial Cells (non renal) None seen 0 - 10 /hpf   Renal Epithel, UA None seen None seen /hpf   Bacteria, UA None seen None seen/Few  Bayer DCA Hb A1c Waived  Result Value Ref Range   HB A1C (BAYER DCA - WAIVED) 5.3 <7.0 %  Urinalysis, Complete  Result Value Ref Range   Specific Gravity, UA 1.020 1.005 - 1.030   pH, UA 7.0 5.0 - 7.5   Color, UA Yellow Yellow   Appearance Ur Clear Clear   Leukocytes,UA Negative Negative   Protein,UA Negative Negative/Trace   Glucose, UA Negative Negative   Ketones, UA Negative Negative  RBC, UA Negative Negative   Bilirubin, UA Negative Negative   Urobilinogen, Ur 0.2 0.2 - 1.0 mg/dL   Nitrite, UA Negative Negative   Microscopic Examination See below:      urinalysis unremarkable today A1C 5.3 in office today  Pertinent labs & imaging results that were available during my  care of the patient were reviewed by me and considered in my medical decision making.  Assessment & Plan:  Brylon was seen today for fatigue.  Diagnoses and all orders for this visit:  Shakiness Diaphoresis Episode of generalized weakness Signs and symptoms concerning for hypoglycemic episodes. Symptomatic care discussed in detail. Will check below labs to rule out potential underlying causes of symptoms. Blood glucose monitoring device prescribed and pt aware to check and log blood sugars, especially when symptomatic. Follow up in 4 weeks or sooner if symptoms worsen. Pt aware of symptoms that require emergent evaluation.  A1C 5.3 today. Urinalysis unremarkable in office.  -     Bayer DCA Hb A1c Waived -     CMP14+EGFR -     Thyroid Panel With TSH -     CBC with Differential/Platelet -     blood glucose meter kit and supplies; Dispense based on patient and insurance preference. Use up to four times daily as directed. (FOR ICD-10 E10.9, E11.9). -     Urinalysis, Complete -     Microscopic Examination     Continue all other maintenance medications.  Follow up plan: Return in about 4 weeks (around 01/22/2019), or if symptoms worsen or fail to improve, for hypoglycemia.  Continue healthy lifestyle choices, including diet (rich in fruits, vegetables, and lean proteins, and low in salt and simple carbohydrates) and exercise (at least 30 minutes of moderate physical activity daily).  Educational handout given for hypoglycemia  The above assessment and management plan was discussed with the patient. The patient verbalized understanding of and has agreed to the management plan. Patient is aware to call the clinic if symptoms persist or worsen. Patient is aware when to return to the clinic for a follow-up visit. Patient educated on when it is appropriate to go to the emergency department.   Monia Pouch, FNP-C [facility] [phone] 12/25/18

## 2018-12-25 NOTE — Patient Instructions (Signed)
Hypoglycemia Hypoglycemia is when the sugar (glucose) level in your blood is too low. Signs of low blood sugar may include:  Feeling: ? Hungry. ? Worried or nervous (anxious). ? Sweaty and clammy. ? Confused. ? Dizzy. ? Sleepy. ? Sick to your stomach (nauseous).  Having: ? A fast heartbeat. ? A headache. ? A change in your vision. ? Tingling or no feeling (numbness) around your mouth, lips, or tongue. ? Jerky movements that you cannot control (seizure).  Having trouble with: ? Moving (coordination). ? Sleeping. ? Passing out (fainting). ? Getting upset easily (irritability). Low blood sugar can happen to people who have diabetes and people who do not have diabetes. Low blood sugar can happen quickly, and it can be an emergency. Treating low blood sugar Low blood sugar is often treated by eating or drinking something sugary right away, such as:  Fruit juice, 4-6 oz (120-150 mL).  Regular soda (not diet soda), 4-6 oz (120-150 mL).  Low-fat milk, 4 oz (120 mL).  Several pieces of hard candy.  Sugar or honey, 1 Tbsp (15 mL). Treating low blood sugar if you have diabetes If you can think clearly and swallow safely, follow the 15:15 rule:  Take 15 grams of a fast-acting carb (carbohydrate). Talk with your doctor about how much you should take.  Always keep a source of fast-acting carb with you, such as: ? Sugar tablets (glucose pills). Take 3-4 pills. ? 6-8 pieces of hard candy. ? 4-6 oz (120-150 mL) of fruit juice. ? 4-6 oz (120-150 mL) of regular (not diet) soda. ? 1 Tbsp (15 mL) honey or sugar.  Check your blood sugar 15 minutes after you take the carb.  If your blood sugar is still at or below 70 mg/dL (3.9 mmol/L), take 15 grams of a carb again.  If your blood sugar does not go above 70 mg/dL (3.9 mmol/L) after 3 tries, get help right away.  After your blood sugar goes back to normal, eat a meal or a snack within 1 hour.  Treating very low blood sugar If your  blood sugar is at or below 54 mg/dL (3 mmol/L), you have very low blood sugar (severe hypoglycemia). This may also cause:  Passing out.  Jerky movements you cannot control (seizure).  Losing consciousness (coma). This is an emergency. Do not wait to see if the symptoms will go away. Get medical help right away. Call your local emergency services (911 in the [place] Do not drive yourself to the hospital. If you have very low blood sugar and you cannot eat or drink, you may need a glucagon shot (injection). A family member or friend should learn how to check your blood sugar and how to give you a glucagon shot. Ask your doctor if you need to have a glucagon shot kit at home. Follow these instructions at home: General instructions  Take over-the-counter and prescription medicines only as told by your doctor.  Stay aware of your blood sugar as told by your doctor.  Limit alcohol intake to no more than 1 drink a day for nonpregnant women and 2 drinks a day for men. One drink equals 12 oz of beer (355 mL), 5 oz of wine (148 mL), or 1 oz of hard liquor (44 mL).  Keep all follow-up visits as told by your doctor. This is important. If you have diabetes:   Follow your diabetes care plan as told by your doctor. Make sure you: ? Know the signs of low blood sugar. ?  Take your medicines as told. ? Follow your exercise and meal plan. ? Eat on time. Do not skip meals. ? Check your blood sugar as often as told by your doctor. Always check it before and after exercise. ? Follow your sick day plan when you cannot eat or drink normally. Make this plan ahead of time with your doctor.  Share your diabetes care plan with: ? Your work or school. ? People you live with.  Check your pee (urine) for ketones: ? When you are sick. ? As told by your doctor.  Carry a card or wear jewelry that says you have diabetes. Contact a doctor if:  You have trouble keeping your blood sugar in your target range.   You have low blood sugar often. Get help right away if:  You still have symptoms after you eat or drink something sugary.  Your blood sugar is at or below 54 mg/dL (3 mmol/L).  You have jerky movements that you cannot control.  You pass out. These symptoms may be an emergency. Do not wait to see if the symptoms will go away. Get medical help right away. Call your local emergency services (911 in the [place] Do not drive yourself to the hospital. Summary  Hypoglycemia happens when the level of sugar (glucose) in your blood is too low.  Low blood sugar can happen to people who have diabetes and people who do not have diabetes. Low blood sugar can happen quickly, and it can be an emergency.  Make sure you know the signs of low blood sugar and know how to treat it.  Always keep a source of sugar (fast-acting carb) with you to treat low blood sugar. This information is not intended to replace advice given to you by your health care provider. Make sure you discuss any questions you have with your health care provider. Document Released: 07/14/2009 Document Revised: 08/10/2018 Document Reviewed: 05/23/2015 Elsevier Patient Education  2020 [employer]vier Inc[employer]  

## 2018-12-26 ENCOUNTER — Telehealth: Payer: Self-pay | Admitting: *Deleted

## 2018-12-26 LAB — CMP14+EGFR
ALT: 38 IU/L (ref 0–44)
AST: 27 IU/L (ref 0–40)
Albumin/Globulin Ratio: 2.7 — ABNORMAL HIGH (ref 1.2–2.2)
Albumin: 5.1 g/dL (ref 4.1–5.2)
Alkaline Phosphatase: 62 IU/L (ref 39–117)
BUN/Creatinine Ratio: 8 — ABNORMAL LOW (ref 9–20)
BUN: 8 mg/dL (ref 6–20)
Bilirubin Total: 0.4 mg/dL (ref 0.0–1.2)
CO2: 27 mmol/L (ref 20–29)
Calcium: 10.2 mg/dL (ref 8.7–10.2)
Chloride: 101 mmol/L (ref 96–106)
Creatinine, Ser: 0.98 mg/dL (ref 0.76–1.27)
GFR calc Af Amer: 127 mL/min/{1.73_m2} (ref >59)
GFR calc non Af Amer: 110 mL/min/{1.73_m2} (ref >59)
Globulin, Total: 1.9 g/dL (ref 1.5–4.5)
Glucose: 86 mg/dL (ref 65–99)
Potassium: 4.2 mmol/L (ref 3.5–5.2)
Sodium: 142 mmol/L (ref 134–144)
Total Protein: 7 g/dL (ref 6.0–8.5)

## 2018-12-26 LAB — CBC WITH DIFFERENTIAL/PLATELET
Basophils Absolute: 0 10*3/uL (ref 0.0–0.2)
Basos: 1 %
EOS (ABSOLUTE): 0.1 10*3/uL (ref 0.0–0.4)
Eos: 2 %
Hematocrit: 48.5 % (ref 37.5–51.0)
Hemoglobin: 16.1 g/dL (ref 13.0–17.7)
Immature Grans (Abs): 0 10*3/uL (ref 0.0–0.1)
Immature Granulocytes: 0 %
Lymphocytes Absolute: 2.4 10*3/uL (ref 0.7–3.1)
Lymphs: 38 %
MCH: 29.1 pg (ref 26.6–33.0)
MCHC: 33.2 g/dL (ref 31.5–35.7)
MCV: 88 fL (ref 79–97)
Monocytes Absolute: 0.7 10*3/uL (ref 0.1–0.9)
Monocytes: 12 %
Neutrophils Absolute: 3.1 10*3/uL (ref 1.4–7.0)
Neutrophils: 47 %
Platelets: 280 10*3/uL (ref 150–450)
RBC: 5.54 x10E6/uL (ref 4.14–5.80)
RDW: 12.6 % (ref 11.6–15.4)
WBC: 6.4 10*3/uL (ref 3.4–10.8)

## 2018-12-26 LAB — THYROID PANEL WITH TSH
Free Thyroxine Index: 1.6 (ref 1.2–4.9)
T3 Uptake Ratio: 26 % (ref 24–39)
T4, Total: 6.3 ug/dL (ref 4.5–12.0)
TSH: 1.49 u[IU]/mL (ref 0.450–4.500)

## 2018-12-26 NOTE — Telephone Encounter (Signed)
Received fax requesting PA for Accuc[employer]k meter. No PA is needed pharmacy must use DME code when billing. TTC pharmacy multiple times but only get a busy tone.

## 2018-12-27 ENCOUNTER — Ambulatory Visit (HOSPITAL_COMMUNITY): Payer: Medicaid Other | Admitting: Psychiatry

## 2018-12-29 NOTE — Telephone Encounter (Signed)
TTC pharmacy multiple times still getting a busy tone, TTC pt as well without answer.

## 2019-01-01 NOTE — Telephone Encounter (Signed)
Multiple calls to pharmacy and attempted to contact pt without return call, will close encounter.

## 2019-01-11 ENCOUNTER — Telehealth (HOSPITAL_COMMUNITY): Payer: Self-pay | Admitting: *Deleted

## 2019-01-11 NOTE — Telephone Encounter (Signed)
Rx SENT REFILL/NEW Rx REQUEST FOR LEVOTHYROXINE ? ON QUANTITY & ? ON DIRECTIONS

## 2019-01-12 NOTE — Telephone Encounter (Signed)
I don't prescribe levothyroxine, they should call pcp

## 2019-01-22 ENCOUNTER — Other Ambulatory Visit: Payer: Self-pay

## 2019-01-23 ENCOUNTER — Encounter: Payer: Self-pay | Admitting: Family Medicine

## 2019-01-23 ENCOUNTER — Ambulatory Visit (INDEPENDENT_AMBULATORY_CARE_PROVIDER_SITE_OTHER): Payer: Medicaid Other | Admitting: Family Medicine

## 2019-01-23 DIAGNOSIS — M25541 Pain in joints of right hand: Secondary | ICD-10-CM | POA: Diagnosis not present

## 2019-01-23 DIAGNOSIS — R509 Fever, unspecified: Secondary | ICD-10-CM | POA: Diagnosis not present

## 2019-01-23 DIAGNOSIS — R42 Dizziness and giddiness: Secondary | ICD-10-CM

## 2019-01-23 DIAGNOSIS — Z20828 Contact with and (suspected) exposure to other viral communicable diseases: Secondary | ICD-10-CM | POA: Diagnosis not present

## 2019-01-23 DIAGNOSIS — M25542 Pain in joints of left hand: Secondary | ICD-10-CM

## 2019-01-23 DIAGNOSIS — R5383 Other fatigue: Secondary | ICD-10-CM

## 2019-01-23 MED ORDER — PREDNISONE 10 MG PO TABS: ORAL_TABLET | ORAL | 0 refills | Status: DC

## 2019-01-23 NOTE — Progress Notes (Signed)
Subjective:    Patient ID: Ryan [name], male    DOB: 17-Jul-1995, 23 y.o.   MRN: [id number]   HPI: Ryan [name] is a 23 y.o. male presenting for dizzy, fever, weakness, sweats, shaky, nausea. In line at this time to get COVID test. Sudden onset frequently of dizziness, fever, sweats, nausea that will last approx. 1/2 hour. Then feels fatigued for several hours after. Can not identify trigger either with activity or P.O. intake or type of food or time of intake.  Notes severe pain in the hands for 1 year. Knees as well, but not as severe.No nknown exposure to ticks. He had COVID test drawn just prior to this visit. He denies cough, dyspnea and diarrhea. No rash.   Depression screen [facility] 2/9 12/25/2018 04/08/2017 11/10/2016 09/28/2016 12/12/2015  Decreased Interest 0 0 0 1 0  Down, Depressed, Hopeless 0 0 - 0 0  PHQ - 2 Score 0 0 0 1 0     Relevant past medical, surgical, family and social history reviewed and updated as indicated.  Interim medical history since our last visit reviewed. Allergies and medications reviewed and updated.  ROS:  Review of Systems  Constitutional: Positive for diaphoresis, fatigue and fever.  HENT: Negative.   Eyes: Negative for visual disturbance.  Respiratory: Negative for cough and shortness of breath.   Cardiovascular: Negative for chest pain and leg swelling.  Gastrointestinal: Negative for abdominal pain, diarrhea, nausea and vomiting.  Genitourinary: Negative for difficulty urinating.  Musculoskeletal: Negative for arthralgias and myalgias.  Skin: Negative for rash.  Neurological: Positive for dizziness, weakness (generalized, with shakiness. Episodic.) and light-headedness. Negative for seizures, syncope and headaches.  Psychiatric/Behavioral: Negative for sleep disturbance.     Social History   Tobacco Use  Smoking Status Never Smoker  Smokeless Tobacco Never Used       Objective:     Wt Readings from Last 3 Encounters:  12/25/18 175 lb  (79.4 kg)  04/08/17 158 lb (71.7 kg) (56 %, Z= 0.15)*  09/28/16 157 lb (71.2 kg) (57 %, Z= 0.18)*   * Growth percentiles are based on CDC (Boys, 2-20 Years) data.     Exam deferred. Pt. Harboring due to COVID 19. Phone visit performed.   Assessment & Plan:   1. Fever, unspecified fever cause   2. Arthralgia of both hands   3. Fatigue, unspecified type   4. Dizziness     Meds ordered this encounter  Medications  . predniSONE (DELTASONE) 10 MG tablet    Sig: Take 5 daily for 3 days followed by 4,3,2 and 1 for 3 days each.    Dispense:  45 tablet    Refill:  0    Orders Placed This Encounter  Procedures  . CBC with Differential/Platelet  . CMP14+EGFR    Order Specific Question:   Has the patient fasted?    Answer:   Yes  . Lyme Ab/Western Blot Reflex  . Rocky mtn spotted fvr abs pnl(IgG+IgM)  . Epstein-Barr virus VCA antibody panel  . CYCLIC CITRUL PEPTIDE ANTIBODY, IGG/IGA  . Arthritis Panel  . HLA-B27 antigen  . Sedimentation rate      Diagnoses and all orders for this visit:  Fever, unspecified fever cause -     CBC with Differential/Platelet -     CMP14+EGFR -     Lyme Ab/Western Blot Reflex -     Rocky mtn spotted fvr abs pnl(IgG+IgM) -     Epstein-Barr virus VCA antibody  panel -     CYCLIC CITRUL PEPTIDE ANTIBODY, IGG/IGA -     Arthritis Panel -     HLA-B27 antigen -     Sedimentation rate  Arthralgia of both hands -     CBC with Differential/Platelet -     CMP14+EGFR -     Lyme Ab/Western Blot Reflex -     Rocky mtn spotted fvr abs pnl(IgG+IgM) -     Epstein-Barr virus VCA antibody panel -     CYCLIC CITRUL PEPTIDE ANTIBODY, IGG/IGA -     Arthritis Panel -     HLA-B27 antigen -     Sedimentation rate  Fatigue, unspecified type -     CBC with Differential/Platelet -     CMP14+EGFR -     Lyme Ab/Western Blot Reflex -     Rocky mtn spotted fvr abs pnl(IgG+IgM) -     Epstein-Barr virus VCA antibody panel -     CYCLIC CITRUL PEPTIDE ANTIBODY,  IGG/IGA -     Arthritis Panel -     HLA-B27 antigen -     Sedimentation rate  Dizziness -     CBC with Differential/Platelet -     CMP14+EGFR -     Lyme Ab/Western Blot Reflex -     Rocky mtn spotted fvr abs pnl(IgG+IgM) -     Epstein-Barr virus VCA antibody panel -     CYCLIC CITRUL PEPTIDE ANTIBODY, IGG/IGA -     Arthritis Panel -     HLA-B27 antigen -     Sedimentation rate  Other orders -     predniSONE (DELTASONE) 10 MG tablet; Take 5 daily for 3 days followed by 4,3,2 and 1 for 3 days each.    Virtual Visit via telephone Note  I discussed the limitations, risks, security and privacy concerns of performing an evaluation and management service by telephone and the availability of in person appointments. The patient was identified with two identifiers. Pt.expressed understanding and agreed to proceed. Pt. Is at home. Dr. Livia Snellen is in his office.  Follow Up Instructions:   I discussed the assessment and treatment plan with the patient. The patient was provided an opportunity to ask questions and all were answered. The patient agreed with the plan and demonstrated an understanding of the instructions.   The patient was advised to call back or seek an in-person evaluation if the symptoms worsen or if the condition fails to improve as anticipated.   Total minutes including chart review and phone contact time: 40   Follow up plan: Return in about 3 weeks (around 02/13/2019).  Claretta Fraise, MD [place]

## 2019-01-24 DIAGNOSIS — R509 Fever, unspecified: Secondary | ICD-10-CM | POA: Diagnosis not present

## 2019-01-24 DIAGNOSIS — M25542 Pain in joints of left hand: Secondary | ICD-10-CM | POA: Diagnosis not present

## 2019-01-24 DIAGNOSIS — M25541 Pain in joints of right hand: Secondary | ICD-10-CM | POA: Diagnosis not present

## 2019-01-24 DIAGNOSIS — R5383 Other fatigue: Secondary | ICD-10-CM | POA: Diagnosis not present

## 2019-01-24 DIAGNOSIS — R42 Dizziness and giddiness: Secondary | ICD-10-CM | POA: Diagnosis not present

## 2019-01-30 LAB — ARTHRITIS PANEL
Basophils Absolute: 0 10*3/uL (ref 0.0–0.2)
Basos: 1 %
EOS (ABSOLUTE): 0.1 10*3/uL (ref 0.0–0.4)
Eos: 2 %
Hematocrit: 48.7 % (ref 37.5–51.0)
Hemoglobin: 16.7 g/dL (ref 13.0–17.7)
Immature Grans (Abs): 0.1 10*3/uL (ref 0.0–0.1)
Immature Granulocytes: 1 %
Lymphocytes Absolute: 2.4 10*3/uL (ref 0.7–3.1)
Lymphs: 35 %
MCH: 28.9 pg (ref 26.6–33.0)
MCHC: 34.3 g/dL (ref 31.5–35.7)
MCV: 84 fL (ref 79–97)
Monocytes Absolute: 0.6 10*3/uL (ref 0.1–0.9)
Monocytes: 9 %
Neutrophils Absolute: 3.7 10*3/uL (ref 1.4–7.0)
Neutrophils: 52 %
Platelets: 288 10*3/uL (ref 150–450)
RBC: 5.77 x10E6/uL (ref 4.14–5.80)
RDW: 11.9 % (ref 11.6–15.4)
Rheumatoid fact SerPl-aCnc: <10 IU/mL (ref 0.0–13.9)
Sed Rate: 2 mm/hr (ref 0–15)
Uric Acid: 5.9 mg/dL (ref 3.7–8.6)
WBC: 6.8 10*3/uL (ref 3.4–10.8)

## 2019-01-30 LAB — LYME AB/WESTERN BLOT REFLEX
LYME DISEASE AB, QUANT, IGM: <0.8 index (ref 0.00–0.79)
Lyme IgG/IgM Ab: <0.91 {ISR} (ref 0.00–0.90)

## 2019-01-30 LAB — ROCKY MTN SPOTTED FVR ABS PNL(IGG+IGM)
RMSF IgG: NEGATIVE
RMSF IgM: 0.22 index (ref 0.00–0.89)

## 2019-01-30 LAB — EPSTEIN-BARR VIRUS (EBV) ANTIBODY PROFILE
EBV NA IgG: 112 U/mL — ABNORMAL HIGH (ref 0.0–17.9)
EBV VCA IgG: 115 U/mL — ABNORMAL HIGH (ref 0.0–17.9)
EBV VCA IgM: <36 U/mL (ref 0.0–35.9)

## 2019-01-30 LAB — CYCLIC CITRUL PEPTIDE ANTIBODY, IGG/IGA: Cyclic Citrullin Peptide Ab: 5 units (ref 0–19)

## 2019-01-30 LAB — CMP14+EGFR
ALT: 35 IU/L (ref 0–44)
AST: 24 IU/L (ref 0–40)
Albumin/Globulin Ratio: 2.8 — ABNORMAL HIGH (ref 1.2–2.2)
Albumin: 5 g/dL (ref 4.1–5.2)
Alkaline Phosphatase: 63 IU/L (ref 39–117)
BUN/Creatinine Ratio: 13 (ref 9–20)
BUN: 12 mg/dL (ref 6–20)
Bilirubin Total: 0.7 mg/dL (ref 0.0–1.2)
CO2: 23 mmol/L (ref 20–29)
Calcium: 9.8 mg/dL (ref 8.7–10.2)
Chloride: 103 mmol/L (ref 96–106)
Creatinine, Ser: 0.96 mg/dL (ref 0.76–1.27)
GFR calc Af Amer: 130 mL/min/{1.73_m2} (ref >59)
GFR calc non Af Amer: 113 mL/min/{1.73_m2} (ref >59)
Globulin, Total: 1.8 g/dL (ref 1.5–4.5)
Glucose: 92 mg/dL (ref 65–99)
Potassium: 4.7 mmol/L (ref 3.5–5.2)
Sodium: 143 mmol/L (ref 134–144)
Total Protein: 6.8 g/dL (ref 6.0–8.5)

## 2019-01-30 LAB — HLA-B27 ANTIGEN: HLA B27: NEGATIVE

## 2019-02-05 ENCOUNTER — Ambulatory Visit (INDEPENDENT_AMBULATORY_CARE_PROVIDER_SITE_OTHER): Payer: Medicaid Other | Admitting: Family Medicine

## 2019-02-05 ENCOUNTER — Other Ambulatory Visit: Payer: Self-pay

## 2019-02-05 ENCOUNTER — Encounter: Payer: Self-pay | Admitting: Family Medicine

## 2019-02-05 DIAGNOSIS — R251 Tremor, unspecified: Secondary | ICD-10-CM

## 2019-02-05 DIAGNOSIS — R109 Unspecified abdominal pain: Secondary | ICD-10-CM | POA: Diagnosis not present

## 2019-02-05 DIAGNOSIS — M25541 Pain in joints of right hand: Secondary | ICD-10-CM

## 2019-02-05 DIAGNOSIS — R531 Weakness: Secondary | ICD-10-CM | POA: Diagnosis not present

## 2019-02-05 DIAGNOSIS — M25542 Pain in joints of left hand: Secondary | ICD-10-CM | POA: Diagnosis not present

## 2019-02-05 DIAGNOSIS — R5383 Other fatigue: Secondary | ICD-10-CM

## 2019-02-05 DIAGNOSIS — R42 Dizziness and giddiness: Secondary | ICD-10-CM | POA: Diagnosis not present

## 2019-02-05 DIAGNOSIS — R1084 Generalized abdominal pain: Secondary | ICD-10-CM

## 2019-02-05 NOTE — Progress Notes (Signed)
Virtual Visit via telephone Note  I connected with Ryan [name] on 02/05/19 at 1555 by telephone and verified that I am speaking with the correct person using two identifiers. Ryan [name] is currently located at home and mother Ryan [name] are currently with her during visit. The provider, Fransisca Kaufmann [name], MD is located in their office at time of visit.  Call ended at 1625  I discussed the limitations, risks, security and privacy concerns of performing an evaluation and management service by telephone and the availability of in person appointments. I also discussed with the patient that there may be a patient responsible charge related to this service. The patient expressed understanding and agreed to proceed.   History and Present Illness: Patient calls in complaining of 3 months of increasing in frequency of episodes of shakiness and dizziness and presyncope and weak and cold and hot and joint aches and pressure in all of his joints and extreme fatigue.  It will last 2 hours and feel weak and tired for the rest of the day. He does have some pains occasionally in left upper abdomen under ribs and into left shoulder. He does have nausea with the episodes.  Patient has been on prednisone more recently. Patient has jaw pain on both sides as well. Denies chest pains. Patient feels flushed and hot and chills when the episodes happen.   No diagnosis found.  Outpatient Encounter Medications as of 02/05/2019  Medication Sig  . blood glucose meter kit and supplies Dispense based on patient and insurance preference. Use up to four times daily as directed. (FOR ICD-10 E10.9, E11.9).  Marland Kitchen lisdexamfetamine (VYVANSE) 60 MG capsule Take 1 capsule (60 mg total) by mouth every morning.  . lisdexamfetamine (VYVANSE) 60 MG capsule Take 1 capsule (60 mg total) by mouth every morning. (Patient not taking: Reported on 12/25/2018)  . lisdexamfetamine (VYVANSE) 60 MG capsule Take 1 capsule (60 mg total) by mouth every  morning. (Patient not taking: Reported on 12/25/2018)  . PARoxetine (PAXIL) 40 MG tablet Take 1 tablet (40 mg total) by mouth daily.  . predniSONE (DELTASONE) 10 MG tablet Take 5 daily for 3 days followed by 4,3,2 and 1 for 3 days each.  . traZODone (DESYREL) 100 MG tablet Take 1 tablet (100 mg total) by mouth at bedtime.   No facility-administered encounter medications on file as of 02/05/2019.     Review of Systems  Constitutional: Positive for chills and fever.  Respiratory: Negative for shortness of breath and wheezing.   Cardiovascular: Negative for chest pain and leg swelling.  Gastrointestinal: Positive for abdominal pain and nausea. Negative for abdominal distention, blood in stool, constipation, diarrhea, rectal pain and vomiting.  Genitourinary: Negative for dysuria and frequency.  Musculoskeletal: Negative for back pain and gait problem.  Skin: Negative for rash.  Neurological: Positive for dizziness, weakness and light-headedness. Negative for syncope, speech difficulty and numbness.  All other systems reviewed and are negative.   Observations/Objective: Patient sounds comfortable and in no acute distress  Assessment and Plan: Problem List Items Addressed This Visit    None    Visit Diagnoses    Arthralgia of both hands    -  Primary   Fatigue, unspecified type       Relevant Orders   ANA,IFA RA Diag Pnl w/rflx Tit/Patn   C-reactive protein   Thyroid Panel With TSH   Dizziness       Relevant Orders   ANA,IFA RA Diag Pnl w/rflx Tit/Patn  C-reactive protein   Thyroid Panel With TSH   Shakiness       Episode of generalized weakness       Relevant Orders   ANA,IFA RA Diag Pnl w/rflx Tit/Patn   C-reactive protein   Thyroid Panel With TSH   Abdominal pain, unspecified abdominal location       Relevant Orders   CT Abdomen Pelvis W Contrast   Generalized abdominal pain       Relevant Orders   CT Abdomen Pelvis W Contrast      Will run some blood work because of  having upper abdominal pain will do a CAT scan of his abdomen, it sounds like it could be either rheumatological versus a hematological disorder versus a possible oncological disorder and we will see if we can track this down, will recheck thyroid because it is symptoms have worsened to be is sure that nothing else is changed and will do an ANA panel and a CRP.  Sed rate was normal Follow Up Instructions:  Follow up as needed   I discussed the assessment and treatment plan with the patient. The patient was provided an opportunity to ask questions and all were answered. The patient agreed with the plan and demonstrated an understanding of the instructions.   The patient was advised to call back or seek an in-person evaluation if the symptoms worsen or if the condition fails to improve as anticipated.  The above assessment and management plan was discussed with the patient. The patient verbalized understanding of and has agreed to the management plan. Patient is aware to call the clinic if symptoms persist or worsen. Patient is aware when to return to the clinic for a follow-up visit. Patient educated on when it is appropriate to go to the emergency department.    I provided 30 minutes of non-face-to-face time during this encounter.    Worthy Rancher, MD

## 2019-02-06 ENCOUNTER — Other Ambulatory Visit: Payer: Self-pay | Admitting: Family Medicine

## 2019-02-06 ENCOUNTER — Ambulatory Visit (HOSPITAL_COMMUNITY)
Admission: RE | Admit: 2019-02-06 | Discharge: 2019-02-06 | Disposition: A | Discharge status: Home / Self Care | Payer: Medicaid Other | Source: Ambulatory Visit | Attending: Family Medicine | Admitting: Family Medicine

## 2019-02-06 DIAGNOSIS — R109 Unspecified abdominal pain: Secondary | ICD-10-CM

## 2019-02-06 DIAGNOSIS — R5383 Other fatigue: Secondary | ICD-10-CM | POA: Diagnosis not present

## 2019-02-06 DIAGNOSIS — R42 Dizziness and giddiness: Secondary | ICD-10-CM | POA: Diagnosis not present

## 2019-02-06 DIAGNOSIS — R1084 Generalized abdominal pain: Secondary | ICD-10-CM | POA: Diagnosis not present

## 2019-02-06 DIAGNOSIS — R531 Weakness: Secondary | ICD-10-CM | POA: Diagnosis not present

## 2019-02-06 MED ORDER — IOHEXOL 300 MG/ML  SOLN
100.0000 mL | Freq: Once | INTRAMUSCULAR | Status: AC | PRN
  Administered 2019-02-06: 17:00:00 100 mL via INTRAVENOUS

## 2019-02-06 MED ORDER — SODIUM CHLORIDE (PF) 0.9 % IJ SOLN
INTRAMUSCULAR | Status: AC
  Filled 2019-02-06: qty 50

## 2019-02-07 ENCOUNTER — Other Ambulatory Visit: Payer: Self-pay | Admitting: *Deleted

## 2019-02-07 DIAGNOSIS — M25542 Pain in joints of left hand: Secondary | ICD-10-CM

## 2019-02-07 DIAGNOSIS — R109 Unspecified abdominal pain: Secondary | ICD-10-CM

## 2019-02-07 DIAGNOSIS — M25541 Pain in joints of right hand: Secondary | ICD-10-CM

## 2019-02-07 DIAGNOSIS — M2550 Pain in unspecified joint: Secondary | ICD-10-CM

## 2019-02-07 LAB — ANA W/REFLEX: Anti Nuclear Antibody (ANA): NEGATIVE

## 2019-02-07 LAB — THYROID PANEL WITH TSH
Free Thyroxine Index: 1.5 (ref 1.2–4.9)
T3 Uptake Ratio: 25 % (ref 24–39)
T4, Total: 6.1 ug/dL (ref 4.5–12.0)
TSH: 1.61 u[IU]/mL (ref 0.450–4.500)

## 2019-02-07 LAB — HIGH SENSITIVITY CRP: CRP, High Sensitivity: 0.22 mg/L (ref 0.00–3.00)

## 2019-02-09 ENCOUNTER — Encounter: Payer: Self-pay | Admitting: Physician Assistant

## 2019-02-12 ENCOUNTER — Telehealth: Payer: Self-pay | Admitting: Family Medicine

## 2019-02-12 DIAGNOSIS — M25541 Pain in joints of right hand: Secondary | ICD-10-CM

## 2019-02-12 DIAGNOSIS — M2550 Pain in unspecified joint: Secondary | ICD-10-CM

## 2019-02-12 NOTE — Telephone Encounter (Signed)
Go ahead and put in the new referral and do it for the rheum on [address]

## 2019-02-12 NOTE — Telephone Encounter (Signed)
Referral placed, mother informed

## 2019-02-20 ENCOUNTER — Other Ambulatory Visit: Payer: Self-pay

## 2019-02-20 ENCOUNTER — Encounter: Payer: Self-pay | Admitting: Physician Assistant

## 2019-02-20 ENCOUNTER — Ambulatory Visit (INDEPENDENT_AMBULATORY_CARE_PROVIDER_SITE_OTHER): Payer: Medicaid Other | Admitting: Physician Assistant

## 2019-02-20 ENCOUNTER — Other Ambulatory Visit (INDEPENDENT_AMBULATORY_CARE_PROVIDER_SITE_OTHER): Payer: Medicaid Other

## 2019-02-20 ENCOUNTER — Telehealth: Payer: Self-pay

## 2019-02-20 ENCOUNTER — Other Ambulatory Visit: Payer: Self-pay | Admitting: Family Medicine

## 2019-02-20 VITALS — BP 144/86 | HR 79 | Temp 97.9°F | Ht 63.0 in | Wt 179.6 lb

## 2019-02-20 DIAGNOSIS — R531 Weakness: Secondary | ICD-10-CM

## 2019-02-20 DIAGNOSIS — M25542 Pain in joints of left hand: Secondary | ICD-10-CM

## 2019-02-20 DIAGNOSIS — R5383 Other fatigue: Secondary | ICD-10-CM

## 2019-02-20 DIAGNOSIS — M2550 Pain in unspecified joint: Secondary | ICD-10-CM

## 2019-02-20 DIAGNOSIS — K219 Gastro-esophageal reflux disease without esophagitis: Secondary | ICD-10-CM | POA: Diagnosis not present

## 2019-02-20 DIAGNOSIS — M25541 Pain in joints of right hand: Secondary | ICD-10-CM

## 2019-02-20 LAB — SARS-COV-2 IGG: SARS-COV-2 IgG: 0.04

## 2019-02-20 LAB — IGA: IgA: 88 mg/dL (ref 68–378)

## 2019-02-20 MED ORDER — PANTOPRAZOLE SODIUM 40 MG PO TBEC: 40.0000 mg | DELAYED_RELEASE_TABLET | Freq: Every day | ORAL | 11 refills | Status: DC

## 2019-02-20 NOTE — Progress Notes (Unsigned)
Placed new referral.

## 2019-02-20 NOTE — Patient Instructions (Signed)
Your provider has requested that you go to the basement level for lab work before leaving today. Press "B" on the elevator. The lab is located at the first door on the left as you exit the elevator.  We have sent the following medications to your pharmacy for you to pick up at your convenience: Protonix  Please call our office in a month to update [place] on your symptoms

## 2019-02-20 NOTE — Progress Notes (Signed)
Subjective:    Patient ID: Ryan [name], male    DOB: 05/11/1995, 23 y.o.   MRN: [id number]  HPI Ryan [name] is a pleasant 23 year old white male, new to GI today referred by Caryl Pina MD for evaluation of GERD.  Patient is also had a multitude of other symptoms recently. He has history of ADHD, depression and bipolar disorder.  He has not had any prior GI evaluation. He says he has had long-term acid reflux symptoms which have been worse over the past 8 or 9 months.  He feels that he has reflux episodes or sour brash several times per day.  He is not bothered much nocturnally but does occur occasionally.  He denies any dysphagia or odynophagia.  He also endorses heartburn.  He has been taking 2 Pepcid AC twice daily and usually has to take a Pepcid AC at lunchtime as well to help control symptoms. Patient says he was very sick in February 2020 with Covid-like symptoms but did not get tested.  He had fever chills cough sore throat myalgias arthralgias and GI symptoms.  He says he felt sick for about a month and then symptoms gradually all resolved for a couple of months and then he had onset in July of episodes of what he describes as extreme fatigue and weakness frequently associated with chills and sweatiness or diaphoresis and arthralgias.  The symptoms come on and episodes but are occurring almost daily and he says he feels terrible and has to lie down.  He gets joint pains particularly in his hands and knees but also less severely more diffusely and says he feels like he is "on fire.  Had some nausea but no vomiting.  Generally his episodes occur late in the evenings and he feels as if he might pass out but does not. He did check his blood sugar on a regular basis for about a month after being seen by his PCP and did not have any evidence of reactive hypoglycemia.  He has been referred to rheumatology but he and his mother are both frustrated at this point because one of the practices told him they  did not have any openings until April of next year. He did have several labs done by primary care including ANA, sed rate, CRP, [facility] spotted fever titers, Lyme titers, thyroid studies, arthritis panel, HLA-B27 antigen, all of which have returned normal or negative.  EBV IgG was positive, EBV IgM negative. He had not been on any new medications supplements, no doses change of meds no known exposures travel etc. No family history of GI disease or autoimmune disease.  Review of Systems Pertinent positive and negative review of systems were noted in the above HPI section.  All other review of systems was otherwise negative.  Outpatient Encounter Medications as of 21/20/2020  Medication Sig  . blood glucose meter kit and supplies Dispense based on patient and insurance preference. Use up to four times daily as directed. (FOR ICD-10 E10.9, E11.9).  . hydrOXYzine (VISTARIL) 100 MG capsule Take 100 mg by mouth 3 (three) times daily as needed.  Marland Kitchen lisdexamfetamine (VYVANSE) 60 MG capsule Take 1 capsule (60 mg total) by mouth every morning.  Marland Kitchen PARoxetine (PAXIL) 40 MG tablet Take 1 tablet (40 mg total) by mouth daily.  . traZODone (DESYREL) 100 MG tablet Take 1 tablet (100 mg total) by mouth at bedtime.  . pantoprazole (PROTONIX) 40 MG tablet Take 1 tablet (40 mg total) by mouth daily.  . [  DISCONTINUED] lisdexamfetamine (VYVANSE) 60 MG capsule Take 1 capsule (60 mg total) by mouth every morning.  . [DISCONTINUED] lisdexamfetamine (VYVANSE) 60 MG capsule Take 1 capsule (60 mg total) by mouth every morning.  . [DISCONTINUED] predniSONE (DELTASONE) 10 MG tablet Take 5 daily for 3 days followed by 4,3,2 and 1 for 3 days each.   No facility-administered encounter medications on file as of 02/20/2019.    Allergies  Allergen Reactions  . Sulfa Antibiotics Rash   There are no active problems to display for this patient.  Social History   Socioeconomic History  . Marital status: Single    Spouse  name: Not on file  . Number of children: Not on file  . Years of education: Not on file  . Highest education level: Not on file  Occupational History  . Not on file  Social Needs  . Financial resource strain: Not on file  . Food insecurity    Worry: Not on file    Inability: Not on file  . Transportation needs    Medical: Not on file    Non-medical: Not on file  Tobacco Use  . Smoking status: Never Smoker  . Smokeless tobacco: Never Used  Substance and Sexual Activity  . Alcohol use: No    Alcohol/week: 0.0 standard drinks    Comment: 12-17-2015 per pt no   . Drug use: No    Comment: 12-17-15 per pt no   . Sexual activity: Never  Lifestyle  . Physical activity    Days per week: Not on file    Minutes per session: Not on file  . Stress: Not on file  Relationships  . Social Herbalist on phone: Not on file    Gets together: Not on file    Attends religious service: Not on file    Active member of club or organization: Not on file    Attends meetings of clubs or organizations: Not on file    Relationship status: Not on file  . Intimate partner violence    Fear of current or ex partner: Not on file    Emotionally abused: Not on file    Physically abused: Not on file    Forced sexual activity: Not on file  Other Topics Concern  . Not on file  Social History Narrative  . Not on file    Mr. Kimes family history includes ADD / ADHD in his brother, brother, father, sister, and sister; Bipolar disorder in his brother, brother, sister, and sister; Depression in his maternal grandfather; Mood Disorder in his brother, brother, father, paternal grandfather, paternal grandmother, sister, and sister.      Objective:    Vitals:   02/20/19 0842  BP: (!) 144/86  Pulse: 79  Temp: 97.9 F (36.6 C)    Physical Exam Well-developed well-nourished young white male  in no acute distress.  Accompanied by his mother height, Weight, 179 BMI 31.8  HEENT; nontraumatic  normocephalic, EOMI, PER R LA, sclera anicteric. Oropharynx; not examined/mask/Covid Neck; supple, no JVD Cardiovascular; regular rate and rhythm with S1-S2, no murmur rub or gallop Pulmonary; Clear bilaterally Abdomen; soft, nontender, nondistended, no palpable mass or hepatosplenomegaly, bowel sounds are active Rectal; not done  skin; benign exam, no jaundice rash or appreciable lesions Extremities; no clubbing cyanosis or edema skin warm and dry patient is wearing copper gloves on his hands, no appreciable edema or erythema of the knees Neuro/Psych; alert and oriented x4, grossly nonfocal mood and affect  appropriate       Assessment & Plan:   #58  23 year old white male with ADHD and bipolar disorder with chronic GERD, manifested with frequent sour brash and heartburn requiring multiple Pepcid AC per day.  #2 7-monthhistory of new onset persistent arthralgias primarily of the hands bilaterally and knees bilaterally, and very frequent episodes of fairly abrupt onset of extreme fatigue, weakness chills, sometimes diaphoresis and a sensation of presyncope.  These episodes are occurring several times per week and usually in the evenings.  During these episodes the arthralgias are much more severe.  Etiology is not clear, extensive lab evaluation to date unrevealing and rheumatology consultation is pending. Patient did have a Covid-like illness in February 2020, rule out sequelae  Plan; start Protonix 40 mg p.o. every morning AC breakfast Antireflux regimen and antireflux diet. We will check TTG and IgA, rule out celiac disease, check Covid 19 antibody level. Do not think that the majority of his symptoms are related to any underlying GI illness, and advised they continue to pursue rheumatology evaluation. Patient is asked to call back in a few weeks if reflux symptoms have not significantly improved.  At that point would consider EGD with Dr. GCarlean Purl  Amy SGenia HaroldPA-C 02/20/2019    Cc: [name], JFransisca Kaufmann MD

## 2019-02-21 LAB — TISSUE TRANSGLUTAMINASE, IGA: (tTG) Ab, IgA: 1 U/mL

## 2019-02-22 ENCOUNTER — Ambulatory Visit (INDEPENDENT_AMBULATORY_CARE_PROVIDER_SITE_OTHER): Payer: Medicaid Other | Admitting: Family Medicine

## 2019-02-22 ENCOUNTER — Telehealth: Payer: Self-pay | Admitting: Family Medicine

## 2019-02-22 ENCOUNTER — Encounter: Payer: Self-pay | Admitting: Family Medicine

## 2019-02-22 ENCOUNTER — Other Ambulatory Visit: Payer: Self-pay

## 2019-02-22 DIAGNOSIS — M25541 Pain in joints of right hand: Secondary | ICD-10-CM | POA: Diagnosis not present

## 2019-02-22 DIAGNOSIS — M25542 Pain in joints of left hand: Secondary | ICD-10-CM

## 2019-02-22 DIAGNOSIS — M2550 Pain in unspecified joint: Secondary | ICD-10-CM

## 2019-02-22 MED ORDER — PREDNISONE 20 MG PO TABS: ORAL_TABLET | ORAL | 0 refills | Status: DC

## 2019-02-22 MED ORDER — DICLOFENAC SODIUM 75 MG PO TBEC: 75.0000 mg | DELAYED_RELEASE_TABLET | Freq: Two times a day (BID) | ORAL | 1 refills | Status: DC

## 2019-02-22 NOTE — Progress Notes (Signed)
Virtual Visit via telephone Note  I connected with Ryan [name] on 02/22/19 at 1740 by telephone and verified that I am speaking with the correct person using two identifiers. Ryan [name] is currently located at home and mother are currently with her during visit. The provider, Fransisca Kaufmann [name], MD is located in their office at time of visit.  Call ended at 1758  I discussed the limitations, risks, security and privacy concerns of performing an evaluation and management service by telephone and the availability of in person appointments. I also discussed with the patient that there may be a patient responsible charge related to this service. The patient expressed understanding and agreed to proceed.   History and Present Illness: Patient is calling in for joint issues that has continued and he saw GI and they recommended and has not gotten into Rheum.  Patient is still having joint pains and some abdominal discomfort but mostly the joint pains are bothering him.  Patient has multiple joints that hurt including his hands and knees and back sometimes sometimes in shoulder and the kind of intermittent but they have been almost daily.  He says he has been taking multiple naproxen and Tylenol and he is eating so many of them and that is just not helping.  No diagnosis found.  Outpatient Encounter Medications as of 02/22/2019  Medication Sig  . blood glucose meter kit and supplies Dispense based on patient and insurance preference. Use up to four times daily as directed. (FOR ICD-10 E10.9, E11.9).  . hydrOXYzine (VISTARIL) 100 MG capsule Take 100 mg by mouth 3 (three) times daily as needed.  Marland Kitchen lisdexamfetamine (VYVANSE) 60 MG capsule Take 1 capsule (60 mg total) by mouth every morning.  . pantoprazole (PROTONIX) 40 MG tablet Take 1 tablet (40 mg total) by mouth daily.  Marland Kitchen PARoxetine (PAXIL) 40 MG tablet Take 1 tablet (40 mg total) by mouth daily.  . traZODone (DESYREL) 100 MG tablet Take 1  tablet (100 mg total) by mouth at bedtime.   No facility-administered encounter medications on file as of 02/22/2019.     Review of Systems  Constitutional: Negative for chills and fever.  Respiratory: Negative for shortness of breath and wheezing.   Cardiovascular: Negative for chest pain and leg swelling.  Gastrointestinal: Positive for abdominal pain and nausea.  Musculoskeletal: Positive for arthralgias. Negative for back pain and gait problem.  Skin: Negative for rash.  All other systems reviewed and are negative.   Observations/Objective: Patient sounds comfortable and in no acute distress  Assessment and Plan: Problem List Items Addressed This Visit    None    Visit Diagnoses    Arthralgia of both hands    -  Primary   Relevant Medications   diclofenac (VOLTAREN) 75 MG EC tablet   predniSONE (DELTASONE) 20 MG tablet   Other Relevant Orders   Ambulatory referral to Rheumatology   Arthralgia of multiple joints       Relevant Medications   diclofenac (VOLTAREN) 75 MG EC tablet   predniSONE (DELTASONE) 20 MG tablet   Other Relevant Orders   Ambulatory referral to Rheumatology     start prednisone taper and then go to take diclofenac after that, make sure you take with food, do not take naproxen with it but do take Tylenol with it as needed Follow Up Instructions: Follow up as needed    I discussed the assessment and treatment plan with the patient. The patient was provided an opportunity to  ask questions and all were answered. The patient agreed with the plan and demonstrated an understanding of the instructions.   The patient was advised to call back or seek an in-person evaluation if the symptoms worsen or if the condition fails to improve as anticipated.  The above assessment and management plan was discussed with the patient. The patient verbalized understanding of and has agreed to the management plan. Patient is aware to call the clinic if symptoms persist or  worsen. Patient is aware when to return to the clinic for a follow-up visit. Patient educated on when it is appropriate to go to the emergency department.    I provided 18 minutes of non-face-to-face time during this encounter.    Worthy Rancher, MD

## 2019-03-08 ENCOUNTER — Ambulatory Visit (INDEPENDENT_AMBULATORY_CARE_PROVIDER_SITE_OTHER): Payer: Medicaid Other | Admitting: Psychiatry

## 2019-03-08 ENCOUNTER — Other Ambulatory Visit: Payer: Self-pay

## 2019-03-08 ENCOUNTER — Encounter (HOSPITAL_COMMUNITY): Payer: Self-pay | Admitting: Psychiatry

## 2019-03-08 DIAGNOSIS — F900 Attention-deficit hyperactivity disorder, predominantly inattentive type: Secondary | ICD-10-CM | POA: Diagnosis not present

## 2019-03-08 DIAGNOSIS — F3162 Bipolar disorder, current episode mixed, moderate: Secondary | ICD-10-CM

## 2019-03-08 MED ORDER — LISDEXAMFETAMINE DIMESYLATE 60 MG PO CAPS: 60.0000 mg | ORAL_CAPSULE | ORAL | 0 refills | Status: DC

## 2019-03-08 MED ORDER — HYDROXYZINE PAMOATE 100 MG PO CAPS: 100.0000 mg | ORAL_CAPSULE | Freq: Three times a day (TID) | ORAL | 2 refills | Status: DC | PRN

## 2019-03-08 MED ORDER — PAROXETINE HCL 40 MG PO TABS: 40.0000 mg | ORAL_TABLET | Freq: Every day | ORAL | 2 refills | Status: DC

## 2019-03-08 MED ORDER — TRAZODONE HCL 100 MG PO TABS: 100.0000 mg | ORAL_TABLET | Freq: Every day | ORAL | 2 refills | Status: DC

## 2019-03-08 NOTE — Progress Notes (Signed)
Virtual Visit via Video Note  I connected with Ryan [name] on 03/08/19 at  2:20 PM EST by a video enabled telemedicine application and verified that I am speaking with the correct person using two identifiers.   I discussed the limitations of evaluation and management by telemedicine and the availability of in person appointments. The patient expressed understanding and agreed to proceed.    I discussed the assessment and treatment plan with the patient. The patient was provided an opportunity to ask questions and all were answered. The patient agreed with the plan and demonstrated an understanding of the instructions.   The patient was advised to call back or seek an in-person evaluation if the symptoms worsen or if the condition fails to improve as anticipated.  I provided 15 minutes of non-face-to-face time during this encounter.   [name], MD  [facility] MD/PA/NP OP Progress Note  03/08/2019 2:42 PM Ryan [name]  MRN:  [id number]  Chief Complaint:  Chief Complaint    Depression; Anxiety; ADD; Follow-up     HPI: This patient is a 23 year old single white male who lives with his family in [place].  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  He states that he has had a lot of medical problems recently.  He states that it started over the summer when he started having "shaky episodes."  At first it was thought he was hypoglycemic but his blood glucose and A1c have been normal.  This is progressed into myalgias fatigue severe joint pain all over his body that comes and's "spells."  His primary doctor has run a myriad of tests for everything from Lyme disease lupus etc. etc.  Nothing has really shown out.  Next week he is going to be seeing a rheumatologist.  He has been on a course of prednisone which seems to have helped.  The patient often is so fatigued that he comes straight home from work and lies down  and cannot do much of anything.  His mother states that there is no other real history of rheumatological disorder in the family.  Fortunately however he states that his mood has been good and the Vyvanse is helping him stay alert and focused.  He is doing fairly well at work.  The hydroxyzine to continues to help with anxiety.  His primary doctor asked him to stop trazodone.  Have sent thinking it was causing more fatigue but since stopping it he is not sleeping and this is actually made him more tired so I suggested he go back to. Visit Diagnosis:    ICD-10-CM   1. Attention deficit hyperactivity disorder (ADHD), predominantly inattentive type  F90.0   2. Bipolar 1 disorder, mixed, moderate (HCC)  F31.62     Past Psychiatric History: Long-term outpatient treatment  Past Medical History:  Past Medical History:  Diagnosis Date  . Anxiety   . Bipolar affective disorder ([place])   . Depression   . Seizures ([place])    seizures began in NOV 2013    Past Surgical History:  Procedure Laterality Date  . TONSILLECTOMY      Family Psychiatric History: see below  Family History:  Family History  Problem Relation Age of Onset  . ADD / ADHD Father   . Mood Disorder Father   . ADD / ADHD Sister   . Mood Disorder Sister   . Bipolar disorder Sister   . ADD /  ADHD Brother   . Mood Disorder Brother   . Bipolar disorder Brother   . Depression Maternal Grandfather   . Mood Disorder Paternal Grandfather   . Mood Disorder Paternal Grandmother   . ADD / ADHD Sister   . Mood Disorder Sister   . Bipolar disorder Sister   . ADD / ADHD Brother   . Mood Disorder Brother   . Bipolar disorder Brother     Social History:  Social History   Socioeconomic History  . Marital status: Single    Spouse name: Not on file  . Number of children: Not on file  . Years of education: Not on file  . Highest education level: Not on file  Occupational History  . Not on file  Social Needs  . Financial resource  strain: Not on file  . Food insecurity    Worry: Not on file    Inability: Not on file  . Transportation needs    Medical: Not on file    Non-medical: Not on file  Tobacco Use  . Smoking status: Never Smoker  . Smokeless tobacco: Never Used  Substance and Sexual Activity  . Alcohol use: No    Alcohol/week: 0.0 standard drinks    Comment: 12-17-2015 per pt no   . Drug use: No    Comment: 12-17-15 per pt no   . Sexual activity: Never  Lifestyle  . Physical activity    Days per week: Not on file    Minutes per session: Not on file  . Stress: Not on file  Relationships  . Social Herbalist on phone: Not on file    Gets together: Not on file    Attends religious service: Not on file    Active member of club or organization: Not on file    Attends meetings of clubs or organizations: Not on file    Relationship status: Not on file  Other Topics Concern  . Not on file  Social History Narrative  . Not on file    Allergies:  Allergies  Allergen Reactions  . Sulfa Antibiotics Rash    Metabolic Disorder Labs: Lab Results  Component Value Date   HGBA1C 5.3 12/25/2018   No results found for: PROLACTIN No results found for: CHOL, TRIG, HDL, CHOLHDL, VLDL, LDLCALC Lab Results  Component Value Date   TSH 1.610 02/06/2019   TSH 1.490 12/25/2018    Therapeutic Level Labs: No results found for: LITHIUM Lab Results  Component Value Date   VALPROATE 106 (H) 05/18/2013   VALPROATE 64.9 03/10/2012   No components found for:  CBMZ  Current Medications: Current Outpatient Medications  Medication Sig Dispense Refill  . blood glucose meter kit and supplies Dispense based on patient and insurance preference. Use up to four times daily as directed. (FOR ICD-10 E10.9, E11.9). 1 each 0  . diclofenac (VOLTAREN) 75 MG EC tablet Take 1 tablet (75 mg total) by mouth 2 (two) times daily. 30 tablet 1  . hydrOXYzine (VISTARIL) 100 MG capsule Take 1 capsule (100 mg total) by  mouth 3 (three) times daily as needed. 90 capsule 2  . lisdexamfetamine (VYVANSE) 60 MG capsule Take 1 capsule (60 mg total) by mouth every morning. 30 capsule 0  . lisdexamfetamine (VYVANSE) 60 MG capsule Take 1 capsule (60 mg total) by mouth every morning. 30 capsule 0  . pantoprazole (PROTONIX) 40 MG tablet Take 1 tablet (40 mg total) by mouth daily. 30 tablet 11  .  PARoxetine (PAXIL) 40 MG tablet Take 1 tablet (40 mg total) by mouth daily. 30 tablet 2  . predniSONE (DELTASONE) 20 MG tablet Take 3 tabs daily for 1 week, then 2 tabs daily for week 2, then 1 tab daily for week 3. 42 tablet 0  . traZODone (DESYREL) 100 MG tablet Take 1 tablet (100 mg total) by mouth at bedtime. 30 tablet 2   No current facility-administered medications for this visit.      Musculoskeletal: Strength & Muscle Tone: within normal limits Gait & Station: normal Patient leans: N/A  Psychiatric Specialty Exam: Review of Systems  Constitutional: Positive for malaise/fatigue.  Musculoskeletal: Positive for back pain, joint pain and myalgias.  Neurological: Positive for weakness.  All other systems reviewed and are negative.   There were no vitals taken for this visit.There is no height or weight on file to calculate BMI.  General Appearance: Casual and Fairly Groomed  Eye Contact:  Good  Speech:  Clear and Coherent  Volume:  Normal  Mood:  Euthymic  Affect:  Appropriate and Congruent  Thought Process:  Goal Directed  Orientation:  Full (Time, Place, and Person)  Thought Content: WDL   Suicidal Thoughts:  No  Homicidal Thoughts:  No  Memory:  Immediate;   Good Recent;   Good Remote;   Good  Judgement:  Good  Insight:  Good  Psychomotor Activity:  Decreased  Concentration:  Concentration: Good and Attention Span: Good  Recall:  Good  Fund of Knowledge: Good  Language: Good  Akathisia:  No  Handed:  Right  AIMS (if indicated): not done  Assets:  Communication Skills Desire for  Improvement Resilience Social Support Talents/Skills  ADL's:  Intact  Cognition: WNL  Sleep:  Fair   Screenings: PHQ2-9     Office Visit from 12/25/2018 in [place] Visit from 04/08/2017 in [facility] Visit from 11/10/2016 in [place] Visit from 09/28/2016 in [facility] Visit from 12/12/2015 in [place]  PHQ-2 Total Score  0  0  0  1  0       Assessment and Plan: This patient is a 23 year old male with a history of depression anxiety ADHD and possible bipolar disorder.  He has developed some sort of presumed rheumatological disorder although none of his testing has shown up anything specific.  He is improving with prednisone.  For now I would like him to continue Vyvanse 60 mg every morning for focus Vistaril 100 mg up to 3 times daily for anxiety, Paxil 40 mg daily for depression and trazodone 100 mg at bedtime for sleep.  He will return to see me in 2 months and perhaps at that point will have more information about what is causing his fatigue and muscle/joint pain   [name], MD 03/08/2019, 2:42 PM

## 2019-03-15 DIAGNOSIS — L700 Acne vulgaris: Secondary | ICD-10-CM | POA: Diagnosis not present

## 2019-03-16 DIAGNOSIS — M2550 Pain in unspecified joint: Secondary | ICD-10-CM | POA: Diagnosis not present

## 2019-03-16 DIAGNOSIS — M064 Inflammatory polyarthropathy: Secondary | ICD-10-CM | POA: Diagnosis not present

## 2019-03-16 DIAGNOSIS — R5383 Other fatigue: Secondary | ICD-10-CM | POA: Diagnosis not present

## 2019-03-23 ENCOUNTER — Telehealth: Payer: Self-pay | Admitting: Family Medicine

## 2019-03-23 DIAGNOSIS — R251 Tremor, unspecified: Secondary | ICD-10-CM

## 2019-03-23 DIAGNOSIS — R42 Dizziness and giddiness: Secondary | ICD-10-CM

## 2019-03-23 DIAGNOSIS — E162 Hypoglycemia, unspecified: Secondary | ICD-10-CM

## 2019-03-23 DIAGNOSIS — R109 Unspecified abdominal pain: Secondary | ICD-10-CM

## 2019-03-27 ENCOUNTER — Other Ambulatory Visit: Payer: Self-pay

## 2019-03-27 DIAGNOSIS — Z20822 Contact with and (suspected) exposure to covid-19: Secondary | ICD-10-CM

## 2019-03-28 LAB — NOVEL CORONAVIRUS, NAA: SARS-CoV-2, NAA: NOT DETECTED

## 2019-03-28 NOTE — Telephone Encounter (Signed)
Placed referral for the patient. 

## 2019-04-05 ENCOUNTER — Telehealth: Payer: Self-pay | Admitting: Family Medicine

## 2019-04-05 DIAGNOSIS — R42 Dizziness and giddiness: Secondary | ICD-10-CM

## 2019-04-05 DIAGNOSIS — E162 Hypoglycemia, unspecified: Secondary | ICD-10-CM

## 2019-04-05 DIAGNOSIS — R251 Tremor, unspecified: Secondary | ICD-10-CM

## 2019-04-05 DIAGNOSIS — R109 Unspecified abdominal pain: Secondary | ICD-10-CM

## 2019-04-05 NOTE — Telephone Encounter (Signed)
Placed new referral because they could not get a visit for their other referral until quite a ways in the future

## 2019-04-06 NOTE — Telephone Encounter (Signed)
Spoke with Mother - She stated she is going to call [facility] and [facility] to see which could see Patient quickest and she will call me back and let me know where to send Ref :)

## 2019-04-06 NOTE — Telephone Encounter (Signed)
Ok sounds good, I placed new referral

## 2019-05-09 ENCOUNTER — Encounter (HOSPITAL_COMMUNITY): Payer: Self-pay | Admitting: Psychiatry

## 2019-05-09 ENCOUNTER — Other Ambulatory Visit: Payer: Self-pay

## 2019-05-09 ENCOUNTER — Ambulatory Visit (INDEPENDENT_AMBULATORY_CARE_PROVIDER_SITE_OTHER): Payer: Medicaid Other | Admitting: Psychiatry

## 2019-05-09 DIAGNOSIS — F419 Anxiety disorder, unspecified: Secondary | ICD-10-CM | POA: Diagnosis not present

## 2019-05-09 DIAGNOSIS — F900 Attention-deficit hyperactivity disorder, predominantly inattentive type: Secondary | ICD-10-CM | POA: Diagnosis not present

## 2019-05-09 DIAGNOSIS — R5381 Other malaise: Secondary | ICD-10-CM

## 2019-05-09 DIAGNOSIS — R5383 Other fatigue: Secondary | ICD-10-CM | POA: Diagnosis not present

## 2019-05-09 DIAGNOSIS — F3162 Bipolar disorder, current episode mixed, moderate: Secondary | ICD-10-CM | POA: Diagnosis not present

## 2019-05-09 MED ORDER — PAROXETINE HCL 40 MG PO TABS: 40.0000 mg | ORAL_TABLET | Freq: Every day | ORAL | 2 refills | Status: DC

## 2019-05-09 MED ORDER — LISDEXAMFETAMINE DIMESYLATE 60 MG PO CAPS: 60.0000 mg | ORAL_CAPSULE | ORAL | 0 refills | Status: DC

## 2019-05-09 MED ORDER — TRAZODONE HCL 100 MG PO TABS: 100.0000 mg | ORAL_TABLET | Freq: Every day | ORAL | 2 refills | Status: DC

## 2019-05-09 MED ORDER — HYDROXYZINE PAMOATE 100 MG PO CAPS: 100.0000 mg | ORAL_CAPSULE | Freq: Three times a day (TID) | ORAL | 2 refills | Status: DC | PRN

## 2019-05-09 NOTE — Progress Notes (Signed)
Virtual Visit via Video Note  I connected with Ryan [name] on 05/09/19 at  1:00 PM EST by a video enabled telemedicine application and verified that I am speaking with the correct person using two identifiers.   I discussed the limitations of evaluation and management by telemedicine and the availability of in person appointments. The patient expressed understanding and agreed to proceed.   I discussed the assessment and treatment plan with the patient. The patient was provided an opportunity to ask questions and all were answered. The patient agreed with the plan and demonstrated an understanding of the instructions.   The patient was advised to call back or seek an in-person evaluation if the symptoms worsen or if the condition fails to improve as anticipated.  I provided 15 minutes of non-face-to-face time during this encounter.   [name], MD  [facility] MD/PA/NP OP Progress Note  05/09/2019 1:19 PM Ryan [name]  MRN:  [id number]  Chief Complaint:  Chief Complaint    Depression; Anxiety; ADHD; Follow-up     HPI: This patient is a 24 year old single white male who lives with his family in [place].  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  The patient returns after 3 months.  He is following up regarding bipolar disorder and ADHD as well as anxiety.  Last time he told me he was having significant symptoms of malaise fatigue severe joint pain and shaking episodes.  He has had numerous tests for various immunological and endocrine disorders and has been seen by rheumatologist.  The only thing that showed up were Epstein barr testing that indicates he has had mono in the past.  They are now thinking he may have an adrenal deficiency and he is going to be seen by an endocrinologist.  He states that he is feeling slightly better but really not that much so.  He is able to do his work but then sleeps much of the time in between.  He still does a  little bit of contact with friends online and listening to music.  He states that despite all of the symptoms his mood has been good and the Vyvanse is helping him stay focused.  He denies significant anxiety depression or suicidal ideation. Visit Diagnosis:    ICD-10-CM   1. Bipolar 1 disorder, mixed, moderate (HCC)  F31.62   2. Attention deficit hyperactivity disorder (ADHD), predominantly inattentive type  F90.0     Past Psychiatric History: Long-term outpatient treatment  Past Medical History:  Past Medical History:  Diagnosis Date  . Anxiety   . Bipolar affective disorder ([place])   . Depression   . Seizures ([place])    seizures began in NOV 2013    Past Surgical History:  Procedure Laterality Date  . TONSILLECTOMY      Family Psychiatric History: see below  Family History:  Family History  Problem Relation Age of Onset  . ADD / ADHD Father   . Mood Disorder Father   . ADD / ADHD Sister   . Mood Disorder Sister   . Bipolar disorder Sister   . ADD / ADHD Brother   . Mood Disorder Brother   . Bipolar disorder Brother   . Depression Maternal Grandfather   . Mood Disorder Paternal Grandfather   . Mood Disorder Paternal Grandmother   . ADD / ADHD Sister   . Mood Disorder Sister   . Bipolar disorder Sister   . ADD / ADHD Brother   .  Mood Disorder Brother   . Bipolar disorder Brother     Social History:  Social History   Socioeconomic History  . Marital status: Single    Spouse name: Not on file  . Number of children: Not on file  . Years of education: Not on file  . Highest education level: Not on file  Occupational History  . Not on file  Tobacco Use  . Smoking status: Never Smoker  . Smokeless tobacco: Never Used  Substance and Sexual Activity  . Alcohol use: No    Alcohol/week: 0.0 standard drinks    Comment: 12-17-2015 per pt no   . Drug use: No    Comment: 12-17-15 per pt no   . Sexual activity: Never  Other Topics Concern  . Not on file  Social  History Narrative  . Not on file   Social Determinants of Health   Financial Resource Strain:   . Difficulty of Paying Living Expenses: Not on file  Food Insecurity:   . Worried About Charity fundraiser in the Last Year: Not on file  . Ran Out of Food in the Last Year: Not on file  Transportation Needs:   . Lack of Transportation (Medical): Not on file  . Lack of Transportation (Non-Medical): Not on file  Physical Activity:   . Days of Exercise per Week: Not on file  . Minutes of Exercise per Session: Not on file  Stress:   . Feeling of Stress : Not on file  Social Connections:   . Frequency of Communication with Friends and Family: Not on file  . Frequency of Social Gatherings with Friends and Family: Not on file  . Attends Religious Services: Not on file  . Active Member of Clubs or Organizations: Not on file  . Attends Archivist Meetings: Not on file  . Marital Status: Not on file    Allergies:  Allergies  Allergen Reactions  . Sulfa Antibiotics Rash    Metabolic Disorder Labs: Lab Results  Component Value Date   HGBA1C 5.3 12/25/2018   No results found for: PROLACTIN No results found for: CHOL, TRIG, HDL, CHOLHDL, VLDL, LDLCALC Lab Results  Component Value Date   TSH 1.610 02/06/2019   TSH 1.490 12/25/2018    Therapeutic Level Labs: No results found for: LITHIUM Lab Results  Component Value Date   VALPROATE 106 (H) 05/18/2013   VALPROATE 64.9 03/10/2012   No components found for:  CBMZ  Current Medications: Current Outpatient Medications  Medication Sig Dispense Refill  . blood glucose meter kit and supplies Dispense based on patient and insurance preference. Use up to four times daily as directed. (FOR ICD-10 E10.9, E11.9). 1 each 0  . diclofenac (VOLTAREN) 75 MG EC tablet Take 1 tablet (75 mg total) by mouth 2 (two) times daily. 30 tablet 1  . hydrOXYzine (VISTARIL) 100 MG capsule Take 1 capsule (100 mg total) by mouth 3 (three) times  daily as needed. 90 capsule 2  . lisdexamfetamine (VYVANSE) 60 MG capsule Take 1 capsule (60 mg total) by mouth every morning. 30 capsule 0  . lisdexamfetamine (VYVANSE) 60 MG capsule Take 1 capsule (60 mg total) by mouth every morning. 30 capsule 0  . pantoprazole (PROTONIX) 40 MG tablet Take 1 tablet (40 mg total) by mouth daily. 30 tablet 11  . PARoxetine (PAXIL) 40 MG tablet Take 1 tablet (40 mg total) by mouth daily. 30 tablet 2  . traZODone (DESYREL) 100 MG tablet Take 1 tablet (  100 mg total) by mouth at bedtime. 30 tablet 2   No current facility-administered medications for this visit.     Musculoskeletal: Strength & Muscle Tone: within normal limits Gait & Station: normal Patient leans: N/A  Psychiatric Specialty Exam: Review of Systems  Constitutional: Positive for fatigue.  Musculoskeletal: Positive for arthralgias.  All other systems reviewed and are negative.   There were no vitals taken for this visit.There is no height or weight on file to calculate BMI.  General Appearance: Casual and Fairly Groomed  Eye Contact:  Good  Speech:  Clear and Coherent  Volume:  Normal  Mood:  Euthymic  Affect:  Appropriate and Congruent  Thought Process:  Goal Directed  Orientation:  Full (Time, Place, and Person)  Thought Content: Rumination   Suicidal Thoughts:  No  Homicidal Thoughts:  No  Memory:  Immediate;   Good Recent;   Good Remote;   Good  Judgement:  Good  Insight:  Good  Psychomotor Activity:  Decreased  Concentration:  Concentration: Good and Attention Span: Good  Recall:  Good  Fund of Knowledge: Good  Language: Good  Akathisia:  No  Handed:  Right  AIMS (if indicated): not done  Assets:  Communication Skills Desire for Improvement Resilience Social Support Talents/Skills Vocational/Educational  ADL's:  Intact  Cognition: WNL  Sleep:  Good   Screenings: PHQ2-9     Office Visit from 12/25/2018 in [place] Visit from  04/08/2017 in [place] Visit from 11/10/2016 in [facility] Visit from 09/28/2016 in [facility] Visit from 12/12/2015 in [place]  PHQ-2 Total Score  0  0  0  1  0       Assessment and Plan: This patient is a 24 year old male with a history of depression anxiety ADHD and possible bipolar disorder.  He is being evaluated for possible rheumatological and/or endocrine disorder but nothing has shown up so far.  Hopefully he will get to the bottom of this as he is still having symptoms of fatigue and malaise.  For now however he seems to be doing well in terms of mental status and we will continue Vyvanse 60 mg every morning for focus, Vistaril 100 mg up to 3 times daily for anxiety, Paxil 40 mg daily for depression and trazodone 100 mg at bedtime for sleep he will return to see me in 2 months   [name], MD 05/09/2019, 1:19 PM

## 2019-05-15 ENCOUNTER — Ambulatory Visit: Payer: Medicaid Other | Admitting: "Endocrinology

## 2019-05-16 ENCOUNTER — Other Ambulatory Visit: Payer: Self-pay

## 2019-05-16 ENCOUNTER — Ambulatory Visit (INDEPENDENT_AMBULATORY_CARE_PROVIDER_SITE_OTHER): Payer: Medicaid Other | Admitting: "Endocrinology

## 2019-05-16 ENCOUNTER — Encounter: Payer: Self-pay | Admitting: "Endocrinology

## 2019-05-16 VITALS — BP 98/68 | HR 101 | Ht 63.0 in | Wt 187.8 lb

## 2019-05-16 DIAGNOSIS — E349 Endocrine disorder, unspecified: Secondary | ICD-10-CM | POA: Diagnosis not present

## 2019-05-16 NOTE — Patient Instructions (Signed)
Advice for Weight Management  -For most of us the best way to lose weight is by diet management. Generally speaking, diet management means consuming less calories intentionally which over time brings about progressive weight loss.  This can be achieved more effectively by restricting carbohydrate consumption to the minimum possible.  So, it is critically important to know your numbers: how much calorie you are consuming and how much calorie you need. More importantly, our carbohydrates sources should be unprocessed or minimally processed complex starch food items.   Sometimes, it is important to balance nutrition by increasing protein intake (animal or plant source), fruits, and vegetables.  -Sticking to a routine mealtime to eat 3 meals a day and avoiding unnecessary snacks is shown to have a big role in weight control. Under normal circumstances, the only time we lose real weight is when we are hungry, so allow hunger to take place- hunger means no food between meal times, only water.  It is not advisable to starve.   -It is better to avoid simple carbohydrates including: Cakes, Sweet Desserts, Ice Cream, Soda (diet and regular), Sweet Tea, Candies, Chips, Cookies, Store Bought Juices, Alcohol in Excess of  1-2 drinks a day, Artificial Sweeteners, Doughnuts, Coffee Creamers, "Sugar-free" Products, etc, etc.  This is not a complete list.....    -Consulting with certified diabetes educators is proven to provide you with the most accurate and current information on diet.  Also, you may be  interested in discussing diet options/exchanges , we can schedule a visit with Ryan [name], RDN, CDE for individualized nutrition education.  -Exercise: If you are able: 30 -60 minutes a day ,4 days a week, or 150 minutes a week.  The longer the better.  Combine stretch, strength, and aerobic activities.  If you were told in the past that you have high risk for cardiovascular diseases, you may seek evaluation by  your heart doctor prior to initiating moderate to intense exercise programs.    

## 2019-05-16 NOTE — Progress Notes (Signed)
Endocrinology Consult Note                                            05/16/2019, 8:59 PM   Subjective:    Patient ID: Ryan [name], male    DOB: 12/24/1995, PCP [name], Fransisca Kaufmann, MD   Past Medical History:  Diagnosis Date  . Anxiety   . Bipolar affective disorder ([place])   . Depression   . Seizures ([place])    seizures began in NOV 2013   Past Surgical History:  Procedure Laterality Date  . TONSILLECTOMY     Social History   Socioeconomic History  . Marital status: Single    Spouse name: Not on file  . Number of children: Not on file  . Years of education: Not on file  . Highest education level: Not on file  Occupational History  . Not on file  Tobacco Use  . Smoking status: Never Smoker  . Smokeless tobacco: Never Used  Substance and Sexual Activity  . Alcohol use: No    Alcohol/week: 0.0 standard drinks    Comment: 12-17-2015 per pt no   . Drug use: No    Comment: 12-17-15 per pt no   . Sexual activity: Never  Other Topics Concern  . Not on file  Social History Narrative  . Not on file   Social Determinants of Health   Financial Resource Strain:   . Difficulty of Paying Living Expenses: Not on file  Food Insecurity:   . Worried About Charity fundraiser in the Last Year: Not on file  . Ran Out of Food in the Last Year: Not on file  Transportation Needs:   . Lack of Transportation (Medical): Not on file  . Lack of Transportation (Non-Medical): Not on file  Physical Activity:   . Days of Exercise per Week: Not on file  . Minutes of Exercise per Session: Not on file  Stress:   . Feeling of Stress : Not on file  Social Connections:   . Frequency of Communication with Friends and Family: Not on file  . Frequency of Social Gatherings with Friends and Family: Not on file  . Attends Religious Services: Not on file  . Active Member of Clubs or Organizations: Not on file  . Attends Archivist Meetings: Not on file  . Marital Status: Not on  file   Family History  Problem Relation Age of Onset  . ADD / ADHD Father   . Mood Disorder Father   . ADD / ADHD Sister   . Mood Disorder Sister   . Bipolar disorder Sister   . ADD / ADHD Brother   . Mood Disorder Brother   . Bipolar disorder Brother   . Depression Maternal Grandfather   . Mood Disorder Paternal Grandfather   . Mood Disorder Paternal Grandmother   . ADD / ADHD Sister   . Mood Disorder Sister   . Bipolar disorder Sister   . ADD / ADHD Brother   . Mood Disorder Brother   . Bipolar disorder Brother    Outpatient Encounter Medications as of 05/16/2019  Medication Sig  . blood glucose meter kit and supplies Dispense based on patient and insurance preference. Use up to four times daily as directed. (FOR ICD-10 E10.9, E11.9).  Marland Kitchen diclofenac (VOLTAREN) 75 MG EC tablet Take  1 tablet (75 mg total) by mouth 2 (two) times daily.  . hydrOXYzine (VISTARIL) 100 MG capsule Take 1 capsule (100 mg total) by mouth 3 (three) times daily as needed.  Marland Kitchen lisdexamfetamine (VYVANSE) 60 MG capsule Take 1 capsule (60 mg total) by mouth every morning.  . lisdexamfetamine (VYVANSE) 60 MG capsule Take 1 capsule (60 mg total) by mouth every morning.  . pantoprazole (PROTONIX) 40 MG tablet Take 1 tablet (40 mg total) by mouth daily.  Marland Kitchen PARoxetine (PAXIL) 40 MG tablet Take 1 tablet (40 mg total) by mouth daily.  . traZODone (DESYREL) 100 MG tablet Take 1 tablet (100 mg total) by mouth at bedtime.   No facility-administered encounter medications on file as of 05/16/2019.   ALLERGIES: Allergies  Allergen Reactions  . Sulfa Antibiotics Rash    VACCINATION STATUS: Immunization History  Administered Date(s) Administered  . DTaP 12/11/1997, 07/01/1998, 07/31/1998, 05/22/1999  . Hepatitis A, Adult 11/10/2016  . Hepatitis A, Ped/Adol-2 Dose 12/22/2015  . Hepatitis B 01/22/1998, 10/31/1997, 07/01/1998  . HiB (PRP-OMP) 12/11/1997, 07/01/1998, 05/22/1999  . IPV 12/11/1997, 07/01/1998, 07/31/1998   . Influenza,inj,Quad PF,6+ Mos 05/02/2014, 03/26/2015, 03/30/2017  . MMR 05/22/1999  . Meningococcal Polysaccharide 12/12/2015  . PPD Test 12/12/2015  . Pneumococcal Conjugate-13 05/22/1999, 10/05/1999  . Td 11/26/2008, 04/08/2017  . Tdap 11/26/2008  . Varicella 05/22/1999, 12/22/2015    HPI Ryan [name] is 24 y.o. male who presents today with a medical history as above. he is being seen in consultation for hypoglycemia requested by [name], Fransisca Kaufmann, MD. History is obtained directly from the patient.  He does not have any history of diabetes nor exposure to diabetes medications.  Patient disputes the fact that he had hypoglycemia saying that he used glucometer and did not document any hypoglycemia.  He wishes to be evaluated for endocrine dysfunction in general. Denies any prior history of adrenal, thyroid, parathyroid dysfunctions.  She has been dealing with progressive weight gain, fatigue.  He also reportedly had shakiness, suspect for hypoglycemia.  He is on paroxetine for mood disorder.  Currently under evaluation by GI for diffuse arthralgias.   He did have normal A1c, thyroid function test recently.  Denies any history of heavy steroid exposure.  He is a non-smoker.    Review of Systems  Constitutional: + recent weight gain/loss, + fatigue, no subjective hyperthermia, no subjective hypothermia Eyes: no blurry vision, no xerophthalmia ENT: no sore throat, no nodules palpated in throat, no dysphagia/odynophagia, no hoarseness Cardiovascular: no Chest Pain, no Shortness of Breath, no palpitations, no leg swelling Respiratory: no cough, no shortness of breath Gastrointestinal: no Nausea/Vomiting/Diarhhea Musculoskeletal: no muscle/joint aches Skin: no rashes Neurological: no tremors, no numbness, no tingling, no dizziness Psychiatric: no depression, no anxiety  Objective:    BP 98/68   Pulse (!) 101   Ht '5\' 3"'  (1.6 m)   Wt 187 lb 12.8 oz (85.2 kg)   BMI 33.27 kg/m   Wt  Readings from Last 3 Encounters:  05/16/19 187 lb 12.8 oz (85.2 kg)  02/20/19 179 lb 9.6 oz (81.5 kg)  12/25/18 175 lb (79.4 kg)    Physical Exam  Constitutional:  Body mass index is 33.27 kg/m.,  not in acute distress, normal state of mind Eyes: PERRLA, EOMI, no exophthalmos ENT: moist mucous membranes, no gross thyromegaly, no gross cervical lymphadenopathy  Musculoskeletal: no gross deformities, strength intact in all four extremities Skin: moist, warm,  + facial acne.  No dorsal cervical fat pad. Neurological: no tremor  with outstretched hands, Deep tendon reflexes normal in bilateral lower extremities.  CMP ( most recent) CMP     Component Value Date/Time   NA 143 01/24/2019 1119   K 4.7 01/24/2019 1119   CL 103 01/24/2019 1119   CO2 23 01/24/2019 1119   GLUCOSE 92 01/24/2019 1119   GLUCOSE 127 (H) 03/10/2012 1902   BUN 12 01/24/2019 1119   CREATININE 0.96 01/24/2019 1119   CALCIUM 9.8 01/24/2019 1119   PROT 6.8 01/24/2019 1119   ALBUMIN 5.0 01/24/2019 1119   AST 24 01/24/2019 1119   ALT 35 01/24/2019 1119   ALKPHOS 63 01/24/2019 1119   BILITOT 0.7 01/24/2019 1119   GFRNONAA 113 01/24/2019 1119   GFRAA 130 01/24/2019 1119     Diabetic Labs (most recent): Lab Results  Component Value Date   HGBA1C 5.3 12/25/2018     Lab Results  Component Value Date   TSH 1.610 02/06/2019   TSH 1.490 12/25/2018      Assessment & Plan:   1. Endocrine disorder, unspecified 2.  Weight gain - JORDYNN PERRIER  is being seen at a kind request of [name], Fransisca Kaufmann, MD. - I have reviewed his available endocrine records and clinically evaluated the patient. - Based on these reviews, he does not have endocrine dysfunction sufficient enough to initiate treatment plan today.    -He does not have specific endocrinopathy at this time.  Patient disputes hypoglycemia.  It is possible that he may have reactive hypoglycemia that treatment of which is essentially dietary modification.   See below.  He is advised to monitor blood glucose when symptoms of shakiness/nervousness happened.  Being his meter for evaluation.  He will be sent for labs to rule out hypogonadism, Cushing syndrome, vitamin D sufficiency.  Review of his recent labs show normal thyroid function test, absence of diabetes/prediabetes.    - Testosterone Total,Free,Bio, Males - Creatinine, urine, 24 hour; Future - Cortisol, urine, 24 hour; Future - Vitamin D, 25-OH,Total,IA(Refl) - Creatinine, urine, 24 hour - Cortisol, urine, 24 hour  To address weight concern: -  Suggestion is made for him to avoid simple carbohydrates  from his diet including Cakes, Sweet Desserts / Pastries, Ice Cream, Soda (diet and regular), Sweet Tea, Candies, Chips, Cookies, Sweet Pastries,  Store Bought Juices, Alcohol in Excess of  1-2 drinks a day, Artificial Sweeteners, Coffee Creamer, and "Sugar-free" Products. This will help patient to have stable blood glucose profile and potentially avoid unintended weight gain.  - I did not initiate any new prescriptions today. - he is advised to maintain close follow up with [name], Fransisca Kaufmann, MD for primary care needs.   - Time spent with the patient: 45 minutes, of which >50% was spent in  counseling him about his fatigue, weight gain and the rest in obtaining information about his symptoms, reviewing his previous labs/studies ( including abstractions from other facilities),  evaluations, and treatments,  and developing a plan to confirm diagnosis and long term treatment based on the latest standards of care/guidelines; and documenting his care.  Rhoderick Moody participated in the discussions, expressed understanding, and voiced agreement with the above plans.  All questions were answered to his satisfaction. he is encouraged to contact clinic should he have any questions or concerns prior to his return visit.  Follow up plan: Return in about 10 days (around 05/26/2019), or labs fasting before  8AM on day of urine submission, for 24 Hour Urine Free Cortisol and Creatinine, Follow up  with Pre-visit Labs.   Glade Lloyd, MD [facility] [address] [place], [place] [address] Phone: [phone]  Fax: [phone]     05/16/2019, 8:59 PM  This note was partially dictated with voice recognition software. Similar sounding words can be transcribed inadequately or may not  be corrected upon review.

## 2019-05-17 DIAGNOSIS — M2550 Pain in unspecified joint: Secondary | ICD-10-CM | POA: Diagnosis not present

## 2019-05-21 DIAGNOSIS — L700 Acne vulgaris: Secondary | ICD-10-CM | POA: Diagnosis not present

## 2019-05-21 DIAGNOSIS — E349 Endocrine disorder, unspecified: Secondary | ICD-10-CM | POA: Diagnosis not present

## 2019-05-24 LAB — TESTOSTERONE TOTAL,FREE,BIO, MALES
Albumin: 4.5 g/dL (ref 3.6–5.1)
Sex Hormone Binding: 10 nmol/L (ref 10–50)
Testosterone, Bioavailable: 293.2 ng/dL (ref >=110.0)
Testosterone, Free: 142.6 pg/mL (ref 46.0–224.0)
Testosterone: 484 ng/dL (ref 250–827)

## 2019-05-24 LAB — CORTISOL, URINE, 24 HOUR
24 Hour urine volume (VMAHVA): 1450 mL
Cortisol (Ur), Free: 14.6 mcg/24 h (ref 4.0–50.0)
RESULTS RECEIVED: 2.09 g/(24.h) (ref 0.50–2.15)

## 2019-05-24 LAB — VITAMIN D 25 HYDROXY (VIT D DEFICIENCY, FRACTURES): Vit D, 25-Hydroxy: 12 ng/mL — ABNORMAL LOW (ref 30–100)

## 2019-05-29 ENCOUNTER — Ambulatory Visit (INDEPENDENT_AMBULATORY_CARE_PROVIDER_SITE_OTHER): Payer: Medicaid Other | Admitting: "Endocrinology

## 2019-05-29 ENCOUNTER — Other Ambulatory Visit: Payer: Self-pay | Admitting: Family Medicine

## 2019-05-29 ENCOUNTER — Encounter: Payer: Self-pay | Admitting: "Endocrinology

## 2019-05-29 DIAGNOSIS — E559 Vitamin D deficiency, unspecified: Secondary | ICD-10-CM

## 2019-05-29 DIAGNOSIS — M2550 Pain in unspecified joint: Secondary | ICD-10-CM

## 2019-05-29 DIAGNOSIS — M25541 Pain in joints of right hand: Secondary | ICD-10-CM

## 2019-05-29 MED ORDER — VITAMIN D3 125 MCG (5000 UT) PO CAPS: 5000.0000 [IU] | ORAL_CAPSULE | Freq: Every day | ORAL | 1 refills | Status: DC

## 2019-05-29 NOTE — Progress Notes (Signed)
05/29/2019, 7:31 PM                                Endocrinology Telehealth Visit Follow up Note -During COVID -19 Pandemic  I connected with Ryan [name] on 05/29/2019   by telephone and verified that I am speaking with the correct person using two identifiers. Ryan [name], 04/25/1996. he has verbally consented to this visit. All issues noted in this document were discussed and addressed. The format was not optimal for physical exam.   Subjective:    Patient ID: Ryan [name], male    DOB: 06/18/1995, PCP [name], Fransisca Kaufmann, MD   Past Medical History:  Diagnosis Date  . Anxiety   . Bipolar affective disorder ([place])   . Depression   . Seizures ([place])    seizures began in NOV 2013   Past Surgical History:  Procedure Laterality Date  . TONSILLECTOMY     Social History   Socioeconomic History  . Marital status: Single    Spouse name: Not on file  . Number of children: Not on file  . Years of education: Not on file  . Highest education level: Not on file  Occupational History  . Not on file  Tobacco Use  . Smoking status: Never Smoker  . Smokeless tobacco: Never Used  Substance and Sexual Activity  . Alcohol use: No    Alcohol/week: 0.0 standard drinks    Comment: 12-17-2015 per pt no   . Drug use: No    Comment: 12-17-15 per pt no   . Sexual activity: Never  Other Topics Concern  . Not on file  Social History Narrative  . Not on file   Social Determinants of Health   Financial Resource Strain:   . Difficulty of Paying Living Expenses: Not on file  Food Insecurity:   . Worried About Charity fundraiser in the Last Year: Not on file  . Ran Out of Food in the Last Year: Not on file  Transportation Needs:   . Lack of Transportation (Medical): Not on file  . Lack of Transportation (Non-Medical): Not on file  Physical Activity:   . Days of Exercise per Week: Not on file  . Minutes of Exercise per Session: Not on  file  Stress:   . Feeling of Stress : Not on file  Social Connections:   . Frequency of Communication with Friends and Family: Not on file  . Frequency of Social Gatherings with Friends and Family: Not on file  . Attends Religious Services: Not on file  . Active Member of Clubs or Organizations: Not on file  . Attends Archivist Meetings: Not on file  . Marital Status: Not on file   Family History  Problem Relation Age of Onset  . ADD / ADHD Father   . Mood Disorder Father   . ADD / ADHD Sister   . Mood Disorder Sister   . Bipolar disorder Sister   . ADD / ADHD Brother   . Mood Disorder Brother   . Bipolar disorder Brother   . Depression Maternal Grandfather   . Mood Disorder Paternal  Grandfather   . Mood Disorder Paternal Grandmother   . ADD / ADHD Sister   . Mood Disorder Sister   . Bipolar disorder Sister   . ADD / ADHD Brother   . Mood Disorder Brother   . Bipolar disorder Brother    Outpatient Encounter Medications as of 05/29/2019  Medication Sig  . blood glucose meter kit and supplies Dispense based on patient and insurance preference. Use up to four times daily as directed. (FOR ICD-10 E10.9, E11.9).  Marland Kitchen Cholecalciferol (VITAMIN D3) 125 MCG (5000 UT) CAPS Take 1 capsule (5,000 Units total) by mouth daily.  . hydrOXYzine (VISTARIL) 100 MG capsule Take 1 capsule (100 mg total) by mouth 3 (three) times daily as needed.  Marland Kitchen lisdexamfetamine (VYVANSE) 60 MG capsule Take 1 capsule (60 mg total) by mouth every morning.  . lisdexamfetamine (VYVANSE) 60 MG capsule Take 1 capsule (60 mg total) by mouth every morning.  . pantoprazole (PROTONIX) 40 MG tablet Take 1 tablet (40 mg total) by mouth daily.  Marland Kitchen PARoxetine (PAXIL) 40 MG tablet Take 1 tablet (40 mg total) by mouth daily.  . traZODone (DESYREL) 100 MG tablet Take 1 tablet (100 mg total) by mouth at bedtime.  . [DISCONTINUED] diclofenac (VOLTAREN) 75 MG EC tablet Take 1 tablet (75 mg total) by mouth 2 (two) times  daily.   No facility-administered encounter medications on file as of 05/29/2019.   ALLERGIES: Allergies  Allergen Reactions  . Sulfa Antibiotics Rash    VACCINATION STATUS: Immunization History  Administered Date(s) Administered  . DTaP 12/11/1997, 07/01/1998, 07/31/1998, 05/22/1999  . Hepatitis A, Adult 11/10/2016  . Hepatitis A, Ped/Adol-2 Dose 12/22/2015  . Hepatitis B 1998/01/23, 10/31/1997, 07/01/1998  . HiB (PRP-OMP) 12/11/1997, 07/01/1998, 05/22/1999  . IPV 12/11/1997, 07/01/1998, 07/31/1998  . Influenza,inj,Quad PF,6+ Mos 05/02/2014, 03/26/2015, 03/30/2017  . MMR 05/22/1999  . Meningococcal Polysaccharide 12/12/2015  . PPD Test 12/12/2015  . Pneumococcal Conjugate-13 05/22/1999, 10/05/1999  . Td 11/26/2008, 04/08/2017  . Tdap 11/26/2008  . Varicella 05/22/1999, 12/22/2015    HPI Ryan [name] is 24 y.o. male who is being engaged in telehealth via telephone in follow-up after he was seen recently in consult for dysglycemia.   PMD: [name], Fransisca Kaufmann, MD. He was sent for further endocrine work-up.  His labs are negative for adrenal dysfunction, thyroid dysfunction, diabetes/prediabetes.  He is found to have vitamin D deficiency.  -He did use glucometer and did not document any hypoglycemia. He has no further dysglycemia since last visit.  He was given dietary advised to restrict processed carbohydrates.  He is on paroxetine for mood disorder.  Currently under evaluation by rheumatology for diffuse arthralgias.   He did have normal A1c, thyroid function test recently.  Denies any history of heavy steroid exposure.  He is a non-smoker.    Review of Systems Limited as above. Objective:    There were no vitals taken for this visit.  Wt Readings from Last 3 Encounters:  05/16/19 187 lb 12.8 oz (85.2 kg)  02/20/19 179 lb 9.6 oz (81.5 kg)  12/25/18 175 lb (79.4 kg)    Physical Exam    CMP ( most recent) CMP     Component Value Date/Time   NA 143 01/24/2019 1119    K 4.7 01/24/2019 1119   CL 103 01/24/2019 1119   CO2 23 01/24/2019 1119   GLUCOSE 92 01/24/2019 1119   GLUCOSE 127 (H) 03/10/2012 1902   BUN 12 01/24/2019 1119   CREATININE 0.96 01/24/2019 1119  CALCIUM 9.8 01/24/2019 1119   PROT 6.8 01/24/2019 1119   ALBUMIN 5.0 01/24/2019 1119   AST 24 01/24/2019 1119   ALT 35 01/24/2019 1119   ALKPHOS 63 01/24/2019 1119   BILITOT 0.7 01/24/2019 1119   GFRNONAA 113 01/24/2019 1119   GFRAA 130 01/24/2019 1119    Diabetic Labs (most recent): Lab Results  Component Value Date   HGBA1C 5.3 12/25/2018     Lab Results  Component Value Date   TSH 1.610 02/06/2019   TSH 1.490 12/25/2018    Recent Results (from the past 2160 hour(s))  Novel Coronavirus, NAA ([facility])     Status: None   Collection Time: 03/27/19 12:00 AM   Specimen: Nasopharyngeal(NP) swabs in vial transport medium   NASOPHARYNGE  TESTING  Result Value Ref Range   SARS-CoV-2, NAA Not Detected Not Detected    Comment: This nucleic acid amplification test was developed and its performance characteristics determined by [employer]. Nucleic acid amplification tests include PCR and TMA. This test has not been FDA cleared or approved. This test has been authorized by FDA under an Emergency Use Authorization (EUA). This test is only authorized for the duration of time the declaration that circumstances exist justifying the authorization of the emergency use of in vitro diagnostic tests for detection of SARS-CoV-2 virus and/or diagnosis of COVID-19 infection under section 564(b)(1) of the Act, 21 U.S.C. 665LDJ-5(T) (1), unless the authorization is terminated or revoked sooner. When diagnostic testing is negative, the possibility of a false negative result should be considered in the context of a patient's recent exposures and the presence of clinical signs and symptoms consistent with COVID-19. An individual without symptoms of COVID-19 and who is not shedding  SARS-CoV-2 virus would  expect to have a negative (not detected) result in this assay.   Testosterone Total,Free,Bio, Males     Status: None   Collection Time: 05/21/19  7:50 AM  Result Value Ref Range   Testosterone 484 250 - 827 ng/dL   Albumin 4.5 3.6 - 5.1 g/dL   Sex Hormone Binding 10 10 - 50 nmol/L   Testosterone, Free 142.6 46.0 - 224.0 pg/mL   Testosterone, Bioavailable 293.2 110.0 - 575 ng/dL  VITAMIN D 25 Hydroxy (Vit-D Deficiency, Fractures)     Status: Abnormal   Collection Time: 05/21/19  7:50 AM  Result Value Ref Range   Vit D, 25-Hydroxy 12 (L) 30 - 100 ng/mL    Comment: Vitamin D Status         25-OH Vitamin D: . Deficiency:                    <20 ng/mL Insufficiency:             20 - 29 ng/mL Optimal:                 > or = 30 ng/mL . For 25-OH Vitamin D testing on patients on  D2-supplementation and patients for whom quantitation  of D2 and D3 fractions is required, the [employer](TM) 25-OH VIT D, (D2,D3), LC/MS/MS is recommended: order  code 402-600-7075 (patients >40yr). See Note 1 . Note 1 . For additional information, please refer to  http://education.[employer].com/faq/FAQ199  (This link is being provided for informational/ educational purposes only.)   Cortisol,free,24 hour urine w/creatinine     Status: None   Collection Time: 05/21/19  7:50 AM  Result Value Ref Range   24 Hour urine volume (VMAHVA) 1,450 mL   Cortisol (Ur), Free  14.6 4.0 - 50.0 mcg/24 h    Comment: . Analysis performed by [employer] . This test was developed and its analytical performance characteristics have been determined by [facility]. It has not been cleared or approved by FDA. This assay has been validated pursuant to the CLIA regulations and is used for clinical purposes.    RESULTS RECEIVED 2.09 0.50 - 2.15 g/24 h     Assessment & Plan:   1.  Vitamin D deficiency  2.  Weight gain -His work-up so far  ruled out adrenal, thyroid dysfunctions.  He does not have diabetes.  He has vitamin D deficiency.  I discussed initiated vitamin D3 5000 units daily for 60 days. -His weight gain is likely due to positive caloric intake.   It is possible that he may have reactive hypoglycemia that treatment of which is essentially dietary modification.   -  Suggestion is made for him to avoid simple carbohydrates  from his diet including Cakes, Sweet Desserts / Pastries, Ice Cream, Soda (diet and regular), Sweet Tea, Candies, Chips, Cookies, Sweet Pastries,  Store Bought Juices, Alcohol in Excess of  1-2 drinks a day, Artificial Sweeteners, Coffee Creamer, and "Sugar-free" Products. This will help patient to have stable blood glucose profile and potentially avoid unintended weight gain.   - he is advised to maintain close follow up with [name], Fransisca Kaufmann, MD for primary care needs.    - Time spent on this patient care encounter:  25 minutes of which 50% was spent in  counseling and the rest reviewing  his current and  previous labs / studies and medications  doses and developing a plan for long term care. Ryan [name]  participated in the discussions, expressed understanding, and voiced agreement with the above plans.  All questions were answered to his satisfaction. he is encouraged to contact clinic should he have any questions or concerns prior to his return visit.  Follow up plan: Return in about 6 months (around 11/26/2019) for Follow up with Pre-visit Labs.   Glade Lloyd, MD [facility] [address] [place], [place] [address] Phone: [phone]  Fax: [phone]     05/29/2019, 7:31 PM  This note was partially dictated with voice recognition software. Similar sounding words can be transcribed inadequately or may not  be corrected upon review.

## 2019-05-31 ENCOUNTER — Telehealth: Payer: Self-pay | Admitting: *Deleted

## 2019-05-31 MED ORDER — MELOXICAM 15 MG PO TABS: 15.0000 mg | ORAL_TABLET | Freq: Every day | ORAL | 2 refills | Status: DC

## 2019-05-31 NOTE — Telephone Encounter (Signed)
Diclofenac Sodium 75mg -Non Preferred by Medicaid  Must try and fail 2 preferred which are: ibuprofen, indomethacin capsule, ketorolac tablet, meloxicam tablet, naproxen EC tablet, naproxen tablet, sulindac tablet

## 2019-05-31 NOTE — Telephone Encounter (Signed)
I sent meloxicam for him because the diclofenac or Voltaren was not covered anymore.

## 2019-05-31 NOTE — Telephone Encounter (Signed)
Patient aware.

## 2019-06-19 ENCOUNTER — Other Ambulatory Visit: Payer: Self-pay

## 2019-06-22 DIAGNOSIS — M2550 Pain in unspecified joint: Secondary | ICD-10-CM | POA: Diagnosis not present

## 2019-07-12 ENCOUNTER — Other Ambulatory Visit: Payer: Self-pay

## 2019-07-12 ENCOUNTER — Ambulatory Visit (INDEPENDENT_AMBULATORY_CARE_PROVIDER_SITE_OTHER): Payer: Medicaid Other | Admitting: Psychiatry

## 2019-07-12 ENCOUNTER — Encounter (HOSPITAL_COMMUNITY): Payer: Self-pay | Admitting: Psychiatry

## 2019-07-12 DIAGNOSIS — F3162 Bipolar disorder, current episode mixed, moderate: Secondary | ICD-10-CM

## 2019-07-12 DIAGNOSIS — F900 Attention-deficit hyperactivity disorder, predominantly inattentive type: Secondary | ICD-10-CM | POA: Diagnosis not present

## 2019-07-12 MED ORDER — LISDEXAMFETAMINE DIMESYLATE 60 MG PO CAPS: 60.0000 mg | ORAL_CAPSULE | ORAL | 0 refills | Status: DC

## 2019-07-12 MED ORDER — TRAZODONE HCL 100 MG PO TABS: 100.0000 mg | ORAL_TABLET | Freq: Every day | ORAL | 2 refills | Status: DC

## 2019-07-12 MED ORDER — HYDROXYZINE PAMOATE 100 MG PO CAPS: 100.0000 mg | ORAL_CAPSULE | Freq: Three times a day (TID) | ORAL | 2 refills | Status: DC | PRN

## 2019-07-12 MED ORDER — PAROXETINE HCL 40 MG PO TABS: 40.0000 mg | ORAL_TABLET | Freq: Every day | ORAL | 2 refills | Status: DC

## 2019-07-12 NOTE — Progress Notes (Signed)
Virtual Visit via Video Note  I connected with Ryan [name] on 07/12/19 at 11:00 AM EST by a video enabled telemedicine application and verified that I am speaking with the correct person using two identifiers.   I discussed the limitations of evaluation and management by telemedicine and the availability of in person appointments. The patient expressed understanding and agreed to proceed.    I discussed the assessment and treatment plan with the patient. The patient was provided an opportunity to ask questions and all were answered. The patient agreed with the plan and demonstrated an understanding of the instructions.   The patient was advised to call back or seek an in-person evaluation if the symptoms worsen or if the condition fails to improve as anticipated.  I provided 15 minutes of non-face-to-face time during this encounter.   [name], MD  [facility] MD/PA/NP OP Progress Note  07/12/2019 11:18 AM Ryan [name]  MRN:  [id number]  Chief Complaint:  Chief Complaint    Depression; Anxiety; ADHD; Follow-up     HPI: This patient is a 24 year old single white male who lives with his family in [place].  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  The patient returns after 2 months.  Over the last several months he was having significant symptoms of malaise, fatigue severe joint pain and shaking episodes.  He has been worked up through endocrinology and rheumatology.  He had a recent ultrasound of his joints which was negative.  He has not found to have adrenal thyroid disease or diabetes.  All of his other labs were negative except for low vitamin D.  He has been on vitamin D supplementation for 2 months and is starting to feel better.  He is not to willing to go out in the sun to gain vitamin D because he "gets hot easily."  The patient states that he is now feeling much better physically.  His mood has been stable and he denies symptoms of depression  anxiety mood swings or difficulty sleeping.  He denies suicidal ideation and he is focusing very well at work.  He is keeping up with friends online. Visit Diagnosis:    ICD-10-CM   1. Attention deficit hyperactivity disorder (ADHD), predominantly inattentive type  F90.0   2. Bipolar 1 disorder, mixed, moderate (HCC)  F31.62     Past Psychiatric History: Long-term outpatient treatment  Past Medical History:  Past Medical History:  Diagnosis Date  . Anxiety   . Bipolar affective disorder ([place])   . Depression   . Seizures ([place])    seizures began in NOV 2013    Past Surgical History:  Procedure Laterality Date  . TONSILLECTOMY      Family Psychiatric History: see below  Family History:  Family History  Problem Relation Age of Onset  . ADD / ADHD Father   . Mood Disorder Father   . ADD / ADHD Sister   . Mood Disorder Sister   . Bipolar disorder Sister   . ADD / ADHD Brother   . Mood Disorder Brother   . Bipolar disorder Brother   . Depression Maternal Grandfather   . Mood Disorder Paternal Grandfather   . Mood Disorder Paternal Grandmother   . ADD / ADHD Sister   . Mood Disorder Sister   . Bipolar disorder Sister   . ADD / ADHD Brother   . Mood Disorder Brother   . Bipolar disorder Brother     Social  History:  Social History   Socioeconomic History  . Marital status: Single    Spouse name: Not on file  . Number of children: Not on file  . Years of education: Not on file  . Highest education level: Not on file  Occupational History  . Not on file  Tobacco Use  . Smoking status: Never Smoker  . Smokeless tobacco: Never Used  Substance and Sexual Activity  . Alcohol use: No    Alcohol/week: 0.0 standard drinks    Comment: 12-17-2015 per pt no   . Drug use: No    Comment: 12-17-15 per pt no   . Sexual activity: Never  Other Topics Concern  . Not on file  Social History Narrative  . Not on file   Social Determinants of Health   Financial Resource  Strain:   . Difficulty of Paying Living Expenses:   Food Insecurity:   . Worried About Charity fundraiser in the Last Year:   . Arboriculturist in the Last Year:   Transportation Needs:   . Film/video editor (Medical):   Marland Kitchen Lack of Transportation (Non-Medical):   Physical Activity:   . Days of Exercise per Week:   . Minutes of Exercise per Session:   Stress:   . Feeling of Stress :   Social Connections:   . Frequency of Communication with Friends and Family:   . Frequency of Social Gatherings with Friends and Family:   . Attends Religious Services:   . Active Member of Clubs or Organizations:   . Attends Archivist Meetings:   Marland Kitchen Marital Status:     Allergies:  Allergies  Allergen Reactions  . Sulfa Antibiotics Rash    Metabolic Disorder Labs: Lab Results  Component Value Date   HGBA1C 5.3 12/25/2018   No results found for: PROLACTIN No results found for: CHOL, TRIG, HDL, CHOLHDL, VLDL, LDLCALC Lab Results  Component Value Date   TSH 1.610 02/06/2019   TSH 1.490 12/25/2018    Therapeutic Level Labs: No results found for: LITHIUM Lab Results  Component Value Date   VALPROATE 106 (H) 05/18/2013   VALPROATE 64.9 03/10/2012   No components found for:  CBMZ  Current Medications: Current Outpatient Medications  Medication Sig Dispense Refill  . blood glucose meter kit and supplies Dispense based on patient and insurance preference. Use up to four times daily as directed. (FOR ICD-10 E10.9, E11.9). 1 each 0  . Cholecalciferol (VITAMIN D3) 125 MCG (5000 UT) CAPS Take 1 capsule (5,000 Units total) by mouth daily. 90 capsule 1  . diclofenac (VOLTAREN) 75 MG EC tablet TAKE 1 TABLET BY MOUTH TWO TIMES DAILY 30 tablet 0  . hydrOXYzine (VISTARIL) 100 MG capsule Take 1 capsule (100 mg total) by mouth 3 (three) times daily as needed. 90 capsule 2  . lisdexamfetamine (VYVANSE) 60 MG capsule Take 1 capsule (60 mg total) by mouth every morning. 30 capsule 0  .  lisdexamfetamine (VYVANSE) 60 MG capsule Take 1 capsule (60 mg total) by mouth every morning. 30 capsule 0  . lisdexamfetamine (VYVANSE) 60 MG capsule Take 1 capsule (60 mg total) by mouth every morning. 30 capsule 0  . meloxicam (MOBIC) 15 MG tablet Take 1 tablet (15 mg total) by mouth daily. 30 tablet 2  . pantoprazole (PROTONIX) 40 MG tablet Take 1 tablet (40 mg total) by mouth daily. 30 tablet 11  . PARoxetine (PAXIL) 40 MG tablet Take 1 tablet (40 mg total) by  mouth daily. 30 tablet 2  . traZODone (DESYREL) 100 MG tablet Take 1 tablet (100 mg total) by mouth at bedtime. 30 tablet 2   No current facility-administered medications for this visit.     Musculoskeletal: Strength & Muscle Tone: within normal limits Gait & Station: normal Patient leans: N/A  Psychiatric Specialty Exam: Review of Systems  Musculoskeletal: Positive for arthralgias.  All other systems reviewed and are negative.   There were no vitals taken for this visit.There is no height or weight on file to calculate BMI.  General Appearance: Casual and Fairly Groomed  Eye Contact:  Good  Speech:  Clear and Coherent  Volume:  Normal  Mood:  Euthymic  Affect:  Appropriate and Congruent  Thought Process:  Goal Directed  Orientation:  Full (Time, Place, and Person)  Thought Content: WDL   Suicidal Thoughts:  No  Homicidal Thoughts:  No  Memory:  Immediate;   Good Recent;   Good Remote;   Good  Judgement:  Good  Insight:  Good  Psychomotor Activity:  Normal  Concentration:  Concentration: Good and Attention Span: Good  Recall:  Good  Fund of Knowledge: Good  Language: Good  Akathisia:  No  Handed:  Right  AIMS (if indicated): not done  Assets:  Communication Skills Desire for Improvement Physical Health Resilience Social Support Talents/Skills  ADL's:  Intact  Cognition: WNL  Sleep:  Good   Screenings: PHQ2-9     Office Visit from 12/25/2018 in [place] Visit from  04/08/2017 in [place] Visit from 11/10/2016 in [place] Visit from 09/28/2016 in [place] Visit from 12/12/2015 in [place]  PHQ-2 Total Score  0  0  0  1  0       Assessment and Plan: This patient is a 24 year old male with a history of depression anxiety ADHD.  He has had complete work-up through rheumatology and endocrine which have not revealed anything other than the vitamin D deficiency.  He is feeling better with the vitamin D supplementation and is having less symptoms malaise fatigue and arthralgias.  He is doing well in terms of mental status and we will continue Vyvanse 60 mg every day for focus, Vistaril 100 mg up to 3 times daily for anxiety, Paxil 40 mg daily for depression and trazodone 100 mg at bedtime for sleep.  He will return to see me in 3 months   [name], MD 07/12/2019, 11:18 AM

## 2019-08-10 NOTE — Progress Notes (Deleted)
Office Visit Note  Patient: Ryan [name]             Date of Birth: [birth year]           MRN: [id number]             PCP: [name], Fransisca Kaufmann, MD Referring: [name], Fransisca Kaufmann, MD Visit Date: 08/20/2019 Occupation: _0 @  Subjective:  No chief complaint on file.   History of Present Illness: AYUUB PENLEY is a 24 y.o. male ***   Activities of Daily Living:  Patient reports morning stiffness for *** {minute/hour:19697}.   Patient {ACTIONS;DENIES/REPORTS:21021675::"Denies"} nocturnal pain.  Difficulty dressing/grooming: {ACTIONS;DENIES/REPORTS:21021675::"Denies"} Difficulty climbing stairs: {ACTIONS;DENIES/REPORTS:21021675::"Denies"} Difficulty getting out of chair: {ACTIONS;DENIES/REPORTS:21021675::"Denies"} Difficulty using hands for taps, buttons, cutlery, and/or writing: {ACTIONS;DENIES/REPORTS:21021675::"Denies"}  No Rheumatology ROS completed.   PMFS History:  Patient Active Problem List   Diagnosis Date Noted  . Vitamin D deficiency 05/29/2019    Past Medical History:  Diagnosis Date  . Anxiety   . Bipolar affective disorder ([place])   . Depression   . Seizures ([place])    seizures began in NOV 2013    Family History  Problem Relation Age of Onset  . ADD / ADHD Father   . Mood Disorder Father   . ADD / ADHD Sister   . Mood Disorder Sister   . Bipolar disorder Sister   . ADD / ADHD Brother   . Mood Disorder Brother   . Bipolar disorder Brother   . Depression Maternal Grandfather   . Mood Disorder Paternal Grandfather   . Mood Disorder Paternal Grandmother   . ADD / ADHD Sister   . Mood Disorder Sister   . Bipolar disorder Sister   . ADD / ADHD Brother   . Mood Disorder Brother   . Bipolar disorder Brother    Past Surgical History:  Procedure Laterality Date  . TONSILLECTOMY     Social History   Social History Narrative  . Not on file   Immunization History  Administered Date(s) Administered  . DTaP 12/11/1997, 07/01/1998, 07/31/1998,  05/22/1999  . Hepatitis A, Adult 11/10/2016  . Hepatitis A, Ped/Adol-2 Dose 12/22/2015  . Hepatitis B 13-Dec-1997, 10/31/1997, 07/01/1998  . HiB (PRP-OMP) 12/11/1997, 07/01/1998, 05/22/1999  . IPV 12/11/1997, 07/01/1998, 07/31/1998  . Influenza,inj,Quad PF,6+ Mos 05/02/2014, 03/26/2015, 03/30/2017  . MMR 05/22/1999  . Meningococcal Polysaccharide 12/12/2015  . PPD Test 12/12/2015  . Pneumococcal Conjugate-13 05/22/1999, 10/05/1999  . Td 11/26/2008, 04/08/2017  . Tdap 11/26/2008  . Varicella 05/22/1999, 12/22/2015     Objective: Vital Signs: There were no vitals taken for this visit.   Physical Exam   Musculoskeletal Exam: ***  CDAI Exam: CDAI Score: - Patient Global: -; Provider Global: - Swollen: -; Tender: - Joint Exam 08/20/2019   No joint exam has been documented for this visit   There is currently no information documented on the homunculus. Go to the Rheumatology activity and complete the homunculus joint exam.  Investigation: No additional findings.  Imaging: No results found.  Recent Labs: Lab Results  Component Value Date   WBC 6.8 01/24/2019   HGB 16.7 01/24/2019   PLT 288 01/24/2019   NA 143 01/24/2019   K 4.7 01/24/2019   CL 103 01/24/2019   CO2 23 01/24/2019   GLUCOSE 92 01/24/2019   BUN 12 01/24/2019   CREATININE 0.96 01/24/2019   BILITOT 0.7 01/24/2019   ALKPHOS 63 01/24/2019   AST 24 01/24/2019   ALT 35 01/24/2019   PROT 6.8  01/24/2019   ALBUMIN 5.0 01/24/2019   CALCIUM 9.8 01/24/2019   GFRAA 130 01/24/2019   QFTBGOLD Negative 11/10/2016    Speciality Comments: No specialty comments available.  Procedures:  No procedures performed Allergies: Sulfa antibiotics   Assessment / Plan:     Visit Diagnoses: No diagnosis found.  Orders: No orders of the defined types were placed in this encounter.  No orders of the defined types were placed in this encounter.   Face-to-face time spent with patient was *** minutes. Greater than 50%  of time was spent in counseling and coordination of care.  Follow-Up Instructions: No follow-ups on file.   Ofilia Neas, PA-C  Note - This record has been created using Dragon software.  Chart creation errors have been sought, but may not always  have been located. Such creation errors do not reflect on  the standard of medical care.

## 2019-08-20 ENCOUNTER — Ambulatory Visit: Payer: Medicaid Other | Admitting: Rheumatology

## 2019-08-26 ENCOUNTER — Other Ambulatory Visit: Payer: Self-pay | Admitting: Family Medicine

## 2019-09-05 ENCOUNTER — Other Ambulatory Visit: Payer: Self-pay | Admitting: "Endocrinology

## 2019-09-27 ENCOUNTER — Other Ambulatory Visit: Payer: Self-pay | Admitting: "Endocrinology

## 2019-10-17 ENCOUNTER — Encounter (HOSPITAL_COMMUNITY): Payer: Self-pay | Admitting: Psychiatry

## 2019-10-17 ENCOUNTER — Telehealth (INDEPENDENT_AMBULATORY_CARE_PROVIDER_SITE_OTHER): Payer: 59 | Admitting: Psychiatry

## 2019-10-17 ENCOUNTER — Other Ambulatory Visit: Payer: Self-pay

## 2019-10-17 DIAGNOSIS — F900 Attention-deficit hyperactivity disorder, predominantly inattentive type: Secondary | ICD-10-CM

## 2019-10-17 DIAGNOSIS — F3162 Bipolar disorder, current episode mixed, moderate: Secondary | ICD-10-CM | POA: Diagnosis not present

## 2019-10-17 MED ORDER — PAROXETINE HCL 40 MG PO TABS: 40.0000 mg | ORAL_TABLET | Freq: Every day | ORAL | 2 refills | Status: DC

## 2019-10-17 MED ORDER — HYDROXYZINE PAMOATE 100 MG PO CAPS: 100.0000 mg | ORAL_CAPSULE | Freq: Three times a day (TID) | ORAL | 2 refills | Status: DC | PRN

## 2019-10-17 MED ORDER — TRAZODONE HCL 100 MG PO TABS: 100.0000 mg | ORAL_TABLET | Freq: Every day | ORAL | 2 refills | Status: DC

## 2019-10-17 MED ORDER — LISDEXAMFETAMINE DIMESYLATE 60 MG PO CAPS: 60.0000 mg | ORAL_CAPSULE | ORAL | 0 refills | Status: DC

## 2019-10-17 NOTE — Progress Notes (Signed)
Ryan [name] the way well

## 2019-10-17 NOTE — Progress Notes (Signed)
Virtual Visit via Video Note  I connected with Ryan [name] on 10/17/19 at  4:20 PM EDT by a video enabled telemedicine application and verified that I am speaking with the correct person using two identifiers.   I discussed the limitations of evaluation and management by telemedicine and the availability of in person appointments. The patient expressed understanding and agreed to proceed.    I discussed the assessment and treatment plan with the patient. The patient was provided an opportunity to ask questions and all were answered. The patient agreed with the plan and demonstrated an understanding of the instructions.   The patient was advised to call back or seek an in-person evaluation if the symptoms worsen or if the condition fails to improve as anticipated.  I provided 15 minutes of non-face-to-face time during this encounter. Patient: Provider home, patient home  Ryan Spiller, MD  [facility] MD/PA/NP OP Progress Note  10/17/2019 4:40 PM PACER [facility]ORN  MRN:  [id number]  Chief Complaint:  Chief Complaint    Anxiety; Depression; ADHD; Follow-up     HPI: This patient is a 24 year old single white male who lives with his family in [place].  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  The patient returns for follow-up after 3 months.  Over the last year he has been having symptoms of malaise severe fatigue joint pain and shaking episodes.  He was found to have a low vitamin D and now that he is on vitamin D replacement he is feeling back to normal.  He is enjoying his job.  He has had a lot of other activities going on such as family weddings and graduation so he has been staying quite busy.  He is sleeping well at night.  The Vyvanse continues to help him stay focused.  He states it might be "too much focus" on the weekends but he does have some Adderall Exar 15 mg left over and he is going to try this for the weekends.  His mood has been stable and he denies  significant anxiety depression or mood swings Visit Diagnosis:    ICD-10-CM   1. Attention deficit hyperactivity disorder (ADHD), predominantly inattentive type  F90.0   2. Bipolar 1 disorder, mixed, moderate (HCC)  F31.62     Past Psychiatric History: Long-term outpatient treatment  Past Medical History:  Past Medical History:  Diagnosis Date  . Anxiety   . Bipolar affective disorder ([place])   . Depression   . Seizures ([place])    seizures began in NOV 2013    Past Surgical History:  Procedure Laterality Date  . TONSILLECTOMY      Family Psychiatric History: See below  Family History:  Family History  Problem Relation Age of Onset  . ADD / ADHD Father   . Mood Disorder Father   . ADD / ADHD Sister   . Mood Disorder Sister   . Bipolar disorder Sister   . ADD / ADHD Brother   . Mood Disorder Brother   . Bipolar disorder Brother   . Depression Maternal Grandfather   . Mood Disorder Paternal Grandfather   . Mood Disorder Paternal Grandmother   . ADD / ADHD Sister   . Mood Disorder Sister   . Bipolar disorder Sister   . ADD / ADHD Brother   . Mood Disorder Brother   . Bipolar disorder Brother     Social History:  Social History   Socioeconomic History  . Marital  status: Single    Spouse name: Not on file  . Number of children: Not on file  . Years of education: Not on file  . Highest education level: Not on file  Occupational History  . Not on file  Tobacco Use  . Smoking status: Never Smoker  . Smokeless tobacco: Never Used  Vaping Use  . Vaping Use: Never used  Substance and Sexual Activity  . Alcohol use: No    Alcohol/week: 0.0 standard drinks    Comment: 12-17-2015 per pt no   . Drug use: No    Comment: 12-17-15 per pt no   . Sexual activity: Never  Other Topics Concern  . Not on file  Social History Narrative  . Not on file   Social Determinants of Health   Financial Resource Strain:   . Difficulty of Paying Living Expenses:   Food Insecurity:    . Worried About Charity fundraiser in the Last Year:   . Arboriculturist in the Last Year:   Transportation Needs:   . Film/video editor (Medical):   Marland Kitchen Lack of Transportation (Non-Medical):   Physical Activity:   . Days of Exercise per Week:   . Minutes of Exercise per Session:   Stress:   . Feeling of Stress :   Social Connections:   . Frequency of Communication with Friends and Family:   . Frequency of Social Gatherings with Friends and Family:   . Attends Religious Services:   . Active Member of Clubs or Organizations:   . Attends Archivist Meetings:   Marland Kitchen Marital Status:     Allergies:  Allergies  Allergen Reactions  . Sulfa Antibiotics Rash    Metabolic Disorder Labs: Lab Results  Component Value Date   HGBA1C 5.3 12/25/2018   No results found for: PROLACTIN No results found for: CHOL, TRIG, HDL, CHOLHDL, VLDL, LDLCALC Lab Results  Component Value Date   TSH 1.610 02/06/2019   TSH 1.490 12/25/2018    Therapeutic Level Labs: No results found for: LITHIUM Lab Results  Component Value Date   VALPROATE 106 (H) 05/18/2013   VALPROATE 64.9 03/10/2012   No components found for:  CBMZ  Current Medications: Current Outpatient Medications  Medication Sig Dispense Refill  . blood glucose meter kit and supplies Dispense based on patient and insurance preference. Use up to four times daily as directed. (FOR ICD-10 E10.9, E11.9). 1 each 0  . Cholecalciferol (VITAMIN D3) 125 MCG (5000 UT) CAPS TAKE ONE CAPSULE BY MOUTH DAILY 100 capsule 0  . diclofenac (VOLTAREN) 75 MG EC tablet TAKE 1 TABLET BY MOUTH TWO TIMES DAILY 30 tablet 0  . hydrOXYzine (VISTARIL) 100 MG capsule Take 1 capsule (100 mg total) by mouth 3 (three) times daily as needed. 90 capsule 2  . lisdexamfetamine (VYVANSE) 60 MG capsule Take 1 capsule (60 mg total) by mouth every morning. 30 capsule 0  . lisdexamfetamine (VYVANSE) 60 MG capsule Take 1 capsule (60 mg total) by mouth every  morning. 30 capsule 0  . lisdexamfetamine (VYVANSE) 60 MG capsule Take 1 capsule (60 mg total) by mouth every morning. 30 capsule 0  . meloxicam (MOBIC) 15 MG tablet TAKE ONE TABLET BY MOUTH DAILY 30 tablet 1  . pantoprazole (PROTONIX) 40 MG tablet Take 1 tablet (40 mg total) by mouth daily. 30 tablet 11  . PARoxetine (PAXIL) 40 MG tablet Take 1 tablet (40 mg total) by mouth daily. 30 tablet 2  . traZODone (  DESYREL) 100 MG tablet Take 1 tablet (100 mg total) by mouth at bedtime. 30 tablet 2   No current facility-administered medications for this visit.     Musculoskeletal: Strength & Muscle Tone: within normal limits Gait & Station: normal Patient leans: N/A  Psychiatric Specialty Exam: Review of Systems  All other systems reviewed and are negative.   There were no vitals taken for this visit.There is no height or weight on file to calculate BMI.  General Appearance: Casual and Fairly Groomed  Eye Contact:  Good  Speech:  Clear and Coherent  Volume:  Normal  Mood:  Euthymic  Affect:  Appropriate and Congruent  Thought Process:  Goal Directed  Orientation:  Full (Time, Place, and Person)  Thought Content: WDL   Suicidal Thoughts:  No  Homicidal Thoughts:  No  Memory:  Immediate;   Good Recent;   Good Remote;   Good  Judgement:  Good  Insight:  Fair  Psychomotor Activity:  Normal  Concentration:  Concentration: Good and Attention Span: Good  Recall:  Good  Fund of Knowledge: Good  Language: Good  Akathisia:  No  Handed:  Right  AIMS (if indicated): not done  Assets:  Communication Skills Desire for Improvement Physical Health Resilience Social Support Talents/Skills  ADL's:  Intact  Cognition: WNL  Sleep:  Good   Screenings: PHQ2-9     Office Visit from 12/25/2018 in [place] Visit from 04/08/2017 in [facility] Visit from 11/10/2016 in [facility] [place] Visit from 09/28/2016 in  [place] Visit from 12/12/2015 in [place]  PHQ-2 Total Score 0 0 0 1 0       Assessment and Plan: This patient is a 24 year old male with a history of depression anxiety and ADHD.  He is feeling much better since he has started vitamin D supplementation.  He is doing well on his current regimen will continue Vyvanse 60 mg daily for focus, Vistaril 100 mg up to 3 times daily for anxiety, Paxil 40 mg daily for depression and trazodone 100 mg at bedtime for sleep.  He will return to see me in 3 months   Ryan Spiller, MD 10/17/2019, 4:40 PM

## 2019-11-27 ENCOUNTER — Ambulatory Visit: Payer: Medicaid Other | Admitting: "Endocrinology

## 2019-11-28 ENCOUNTER — Other Ambulatory Visit: Payer: Self-pay | Admitting: *Deleted

## 2019-11-28 MED ORDER — ACCU-CHEK SOFTCLIX LANCETS MISC
5 refills | Status: DC
Start: 2019-11-28 — End: 2022-09-03

## 2019-11-28 MED ORDER — ACCU-CHEK GUIDE VI STRP: ORAL_STRIP | 5 refills | Status: DC

## 2019-12-05 ENCOUNTER — Other Ambulatory Visit: Payer: Self-pay | Admitting: *Deleted

## 2019-12-05 MED ORDER — ACCU-CHEK FASTCLIX LANCET KIT: PACK | 0 refills | Status: DC

## 2019-12-14 ENCOUNTER — Telehealth: Payer: Self-pay | Admitting: Physician Assistant

## 2019-12-14 NOTE — Telephone Encounter (Signed)
Pt taking protonix 40 daily and his reflux is bothering him again. States his diet has not changed. Pt scheduled to see Mike Gip PA 12/26/19@1 :30pm. Mother is aware of appt.

## 2019-12-20 ENCOUNTER — Other Ambulatory Visit: Payer: Self-pay | Admitting: Family Medicine

## 2019-12-26 ENCOUNTER — Ambulatory Visit (INDEPENDENT_AMBULATORY_CARE_PROVIDER_SITE_OTHER): Payer: Medicaid Other | Admitting: Physician Assistant

## 2019-12-26 ENCOUNTER — Encounter: Payer: Self-pay | Admitting: Physician Assistant

## 2019-12-26 VITALS — BP 94/60 | HR 104 | Ht 63.25 in | Wt 171.2 lb

## 2019-12-26 DIAGNOSIS — U071 COVID-19: Secondary | ICD-10-CM | POA: Diagnosis not present

## 2019-12-26 DIAGNOSIS — K219 Gastro-esophageal reflux disease without esophagitis: Secondary | ICD-10-CM | POA: Diagnosis not present

## 2019-12-26 MED ORDER — PANTOPRAZOLE SODIUM 40 MG PO TBEC: 40.0000 mg | DELAYED_RELEASE_TABLET | Freq: Two times a day (BID) | ORAL | 11 refills | Status: DC

## 2019-12-26 NOTE — Progress Notes (Signed)
Subjective:    Patient ID: Ryan [name], male    DOB: 01/24/1996, 24 y.o.   MRN: [id number]  HPI Ryan [name] is a 24 year old white male, known to myself and established with Dr. Carlean Purl at time of last visit October 2021.  He comes in today for follow-up and with recent increase in reflux symptoms and back pain. He has history of ADHD, bipolar disorder, and when seen in October 2020 had also been having ongoing persistent arthralgias primarily of his hands and knees and episodes of severe fatigue.  He had a Covid-like illness in February 2020.  He has subsequently had both endocrine and rheumatology evaluation.  He has been maintained on meloxicam with almost complete resolution of his arthralgias as long as he is on medication.  He was diagnosed with a severe vitamin D deficiency by endocrinology and has had significant improvement and fatigue with replacement.  He was not found to have any evidence of RA, and in fact told that he likely had post Covid related persistent arthralgias etc. He has been on Protonix 40 mg p.o. daily over the past 10 months.  He said that had been working very well but over the past few months had been noticing intermittent pain in his mid back between his shoulder blades.  After discussing with other family members it was suggested this may be related to reflux.  He had called here just within the past couple of weeks and Protonix was increased to twice daily.  He was only able to take that for several days before he ran out of medication but says that it definitely helped his symptoms.  He has no complaints of nausea or vomiting, appetite has been fine.  No dysphagia or odynophagia.  He is not having any nocturnal symptoms.  Review of Systems Pertinent positive and negative review of systems were noted in the above HPI section.  All other review of systems was otherwise negative.  Outpatient Encounter Medications as of 12/26/2019  Medication Sig  . Accu-Chek Softclix Lancets  lancets Check BS QID Dx E11.9  . blood glucose meter kit and supplies Dispense based on patient and insurance preference. Use up to four times daily as directed. (FOR ICD-10 E10.9, E11.9).  Marland Kitchen Cholecalciferol (VITAMIN D3) 125 MCG (5000 UT) CAPS TAKE ONE CAPSULE BY MOUTH DAILY  . diclofenac (VOLTAREN) 75 MG EC tablet TAKE 1 TABLET BY MOUTH TWO TIMES DAILY  . glucose blood (ACCU-CHEK GUIDE) test strip Check BS QID Dx E11.9  . hydrOXYzine (VISTARIL) 100 MG capsule Take 1 capsule (100 mg total) by mouth 3 (three) times daily as needed.  . Lancets Misc. (ACCU-CHEK FASTCLIX LANCET) KIT Check BS QID Dx E11.9  . lisdexamfetamine (VYVANSE) 60 MG capsule Take 1 capsule (60 mg total) by mouth every morning.  . lisdexamfetamine (VYVANSE) 60 MG capsule Take 1 capsule (60 mg total) by mouth every morning.  . lisdexamfetamine (VYVANSE) 60 MG capsule Take 1 capsule (60 mg total) by mouth every morning.  . meloxicam (MOBIC) 15 MG tablet TAKE ONE TABLET BY MOUTH DAILY  . pantoprazole (PROTONIX) 40 MG tablet Take 1 tablet (40 mg total) by mouth 2 (two) times daily.  Marland Kitchen PARoxetine (PAXIL) 40 MG tablet Take 1 tablet (40 mg total) by mouth daily.  . traZODone (DESYREL) 100 MG tablet Take 1 tablet (100 mg total) by mouth at bedtime.  . [DISCONTINUED] pantoprazole (PROTONIX) 40 MG tablet Take 1 tablet (40 mg total) by mouth daily.   No facility-administered encounter  medications on file as of 12/26/2019.   Allergies  Allergen Reactions  . Sulfa Antibiotics Rash   Patient Active Problem List   Diagnosis Date Noted  . GERD (gastroesophageal reflux disease) 12/26/2019  . COVID-19 virus infection 12/26/2019  . Vitamin D deficiency 05/29/2019   Social History   Socioeconomic History  . Marital status: Single    Spouse name: Not on file  . Number of children: Not on file  . Years of education: Not on file  . Highest education level: Not on file  Occupational History  . Not on file  Tobacco Use  . Smoking  status: Never Smoker  . Smokeless tobacco: Never Used  Vaping Use  . Vaping Use: Never used  Substance and Sexual Activity  . Alcohol use: No    Alcohol/week: 0.0 standard drinks    Comment: 12-17-2015 per pt no   . Drug use: No    Comment: 12-17-15 per pt no   . Sexual activity: Never  Other Topics Concern  . Not on file  Social History Narrative  . Not on file   Social Determinants of Health   Financial Resource Strain:   . Difficulty of Paying Living Expenses: Not on file  Food Insecurity:   . Worried About Charity fundraiser in the Last Year: Not on file  . Ran Out of Food in the Last Year: Not on file  Transportation Needs:   . Lack of Transportation (Medical): Not on file  . Lack of Transportation (Non-Medical): Not on file  Physical Activity:   . Days of Exercise per Week: Not on file  . Minutes of Exercise per Session: Not on file  Stress:   . Feeling of Stress : Not on file  Social Connections:   . Frequency of Communication with Friends and Family: Not on file  . Frequency of Social Gatherings with Friends and Family: Not on file  . Attends Religious Services: Not on file  . Active Member of Clubs or Organizations: Not on file  . Attends Archivist Meetings: Not on file  . Marital Status: Not on file  Intimate Partner Violence:   . Fear of Current or Ex-Partner: Not on file  . Emotionally Abused: Not on file  . Physically Abused: Not on file  . Sexually Abused: Not on file    Mr. Montz family history includes ADD / ADHD in his brother, brother, father, sister, and sister; Bipolar disorder in his brother, brother, sister, and sister; Depression in his maternal grandfather; Mood Disorder in his brother, brother, father, paternal grandfather, paternal grandmother, sister, and sister.      Objective:    Vitals:   12/26/19 1328  BP: 94/60  Pulse: (!) 104    Physical Exam Well-developed well-nourished young male in no acute distress.  Accompanied  by mother height, Weight 171, BMI 30.1  HEENT; nontraumatic normocephalic, EOMI, PE R R LA, sclera anicteric. Oropharynx; not examined today Neck; supple, no JVD Cardiovascular; regular rate and rhythm with S1-S2, no murmur rub or gallop Pulmonary; Clear bilaterally Abdomen; soft, nontender, nondistended, no palpable mass or hepatosplenomegaly, bowel sounds are active Rectal; not done Skin; benign exam, no jaundice rash or appreciable lesions Extremities; no clubbing cyanosis or edema skin warm and dry Neuro/Psych; alert and oriented x4, grossly nonfocal mood and affect appropriate       Assessment & Plan:   #40 24 year old white male with history of chronic GERD, on PPI therapy over the past 10 months  with improvement in symptoms.  Over the past couple of months despite Protonix daily has been noticing intermittent mid back pain/subscapular pain possibly related to GERD.  He had improvement in symptoms with twice daily dosing.  #2 vitamin D deficiency-on replacement 3.  Bipolar disorder 4.  ADHD 5.  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  Suggested he try to decrease dose to half tablet daily if unable to discontinue. Refill Protonix 40 mg p.o. twice daily AC breakfast and AC dinner With refractory symptoms now requiring twice daily dosing, patient will be scheduled for EGD with Dr. Carlean Purl.  Procedure was discussed in detail with the patient including indications risks and benefits and he is agreeable to proceed. He has completed COVID-19 vaccination.    Latonja Bobeck Genia Harold PA-C 12/26/2019   Cc: [name], Fransisca Kaufmann, MD

## 2019-12-26 NOTE — Patient Instructions (Addendum)
If you are age 22 or older, your body mass index should be between 23-30. Your Body mass index is 30.1 kg/m. If this is out of the aforementioned range listed, please consider follow up with your Primary Care Provider.  If you are age 46 or younger, your body mass index should be between 19-25. Your Body mass index is 30.1 kg/m. If this is out of the aformentioned range listed, please consider follow up with your Primary Care Provider.   You have been scheduled for an endoscopy. Please follow written instructions given to you at your visit today. If you use inhalers (even only as needed), please bring them with you on the day of your procedure.  Continue Pantoprazole 40 mg 1 tablet twice a day.   Please follow a GERD diet.  Follow up pending the results of you Endoscopy.

## 2019-12-27 ENCOUNTER — Other Ambulatory Visit: Payer: Self-pay | Admitting: Family Medicine

## 2020-01-01 ENCOUNTER — Encounter: Payer: Medicaid Other | Admitting: Gastroenterology

## 2020-01-02 ENCOUNTER — Other Ambulatory Visit: Payer: Self-pay

## 2020-01-02 ENCOUNTER — Ambulatory Visit (AMBULATORY_SURGERY_CENTER): Payer: Medicaid Other | Admitting: Internal Medicine

## 2020-01-02 ENCOUNTER — Encounter: Payer: Self-pay | Admitting: Internal Medicine

## 2020-01-02 VITALS — BP 112/54 | HR 94 | Temp 97.2°F | Resp 13 | Ht 63.25 in | Wt 171.0 lb

## 2020-01-02 DIAGNOSIS — K219 Gastro-esophageal reflux disease without esophagitis: Secondary | ICD-10-CM

## 2020-01-02 DIAGNOSIS — F319 Bipolar disorder, unspecified: Secondary | ICD-10-CM | POA: Diagnosis not present

## 2020-01-02 MED ORDER — SODIUM CHLORIDE 0.9 % IV SOLN: 500.0000 mL | Freq: Once | INTRAVENOUS | Status: DC

## 2020-01-02 NOTE — Patient Instructions (Addendum)
The esophagus, stomach and upper intestine all look ok.  Since you are improved with the twice daily pantoprazole - stay on that.  Follow-up with [name], Elige Radon, MD and see [place] as needed.  I appreciate the opportunity to care for you. Iva Boop, MD, FACG  YOU HAD AN ENDOSCOPIC PROCEDURE TODAY AT THE [facility]:   Refer to the procedure report that was given to you for any specific questions about what was found during the examination.  If the procedure report does not answer your questions, please call your gastroenterologist to clarify.  If you requested that your care partner not be given the details of your procedure findings, then the procedure report has been included in a sealed envelope for you to review at your convenience later.  YOU SHOULD EXPECT: Some feelings of bloating in the abdomen. Passage of more gas than usual.  Walking can help get rid of the air that was put into your GI tract during the procedure and reduce the bloating. If you had a lower endoscopy (such as a colonoscopy or flexible sigmoidoscopy) you may notice spotting of blood in your stool or on the toilet paper. If you underwent a bowel prep for your procedure, you may not have a normal bowel movement for a few days.  Please Note:  You might notice some irritation and congestion in your nose or some drainage.  This is from the oxygen used during your procedure.  There is no need for concern and it should clear up in a day or so.  SYMPTOMS TO REPORT IMMEDIATELY:   Following upper endoscopy (EGD)  Vomiting of blood or coffee ground material  New chest pain or pain under the shoulder blades  Painful or persistently difficult swallowing  New shortness of breath  Fever of 100F or higher  Black, tarry-looking stools  For urgent or emergent issues, a gastroenterologist can be reached at any hour by calling [phone]. Do not use MyChart messaging for urgent concerns.    DIET:  We do  recommend a small meal at first, but then you may proceed to your regular diet.  Drink plenty of fluids but you should avoid alcoholic beverages for 24 hours.  ACTIVITY:  You should plan to take it easy for the rest of today and you should NOT DRIVE or use heavy machinery until tomorrow (because of the sedation medicines used during the test).    FOLLOW UP: Our staff will call the number listed on your records 48-72 hours following your procedure to check on you and address any questions or concerns that you may have regarding the information given to you following your procedure. If we do not reach you, we will leave a message.  We will attempt to reach you two times.  During this call, we will ask if you have developed any symptoms of COVID 19. If you develop any symptoms (ie: fever, flu-like symptoms, shortness of breath, cough etc.) before then, please call [phone].  If you test positive for Covid 19 in the 2 weeks post procedure, please call and report this information to [place].    If any biopsies were taken you will be contacted by phone or by letter within the next 1-3 weeks.  Please call us at [phone] if you have not heard about the biopsies in 3 weeks.    SIGNATURES/CONFIDENTIALITY: You and/or your care partner have signed paperwork which will be entered into your electronic medical record.  These signatures  attest to the fact that that the information above on your After Visit Summary has been reviewed and is understood.  Full responsibility of the confidentiality of this discharge information lies with you and/or your care-partner.

## 2020-01-02 NOTE — Progress Notes (Signed)
Report to PACU, RN, vss, BBS= Clear.  

## 2020-01-02 NOTE — Progress Notes (Signed)
Vs -s hodges rn  Front desk=AR

## 2020-01-02 NOTE — Op Note (Signed)
[facility] Patient Name: Ryan [name] Procedure Date: 01/02/2020 2:12 PM MRN: [id number] Endoscopist: Iva Boop , MD Age: 24 Referring MD:  Date of Birth: January 18, 1996 Gender: Male Account #: [id number] Procedure:                Upper GI endoscopy Indications:              Esophageal reflux symptoms that persist despite                            appropriate therapy Medicines:                Propofol per Anesthesia, Monitored Anesthesia Care Procedure:                Pre-Anesthesia Assessment:                           - Prior to the procedure, a History and Physical                            was performed, and patient medications and                            allergies were reviewed. The patient's tolerance of                            previous anesthesia was also reviewed. The risks                            and benefits of the procedure and the sedation                            options and risks were discussed with the patient.                            All questions were answered, and informed consent                            was obtained. Prior Anticoagulants: The patient has                            taken no previous anticoagulant or antiplatelet                            agents. ASA Grade Assessment: II - A patient with                            mild systemic disease. After reviewing the risks                            and benefits, the patient was deemed in                            satisfactory condition to undergo the procedure.  After obtaining informed consent, the endoscope was                            passed under direct vision. Throughout the                            procedure, the patient's blood pressure, pulse, and                            oxygen saturations were monitored continuously. The                            Endoscope was introduced through the mouth, and                            advanced to the  second part of duodenum. The upper                            GI endoscopy was accomplished without difficulty.                            The patient tolerated the procedure well. Scope In: Scope Out: Findings:                 The esophagus was normal.                           The stomach was normal.                           The examined duodenum was normal.                           The cardia and gastric fundus were normal on                            retroflexion. Complications:            No immediate complications. Impression:               - Normal esophagus.                           - Normal stomach.                           - Normal examined duodenum.                           - No specimens collected. Recommendation:           - Patient has a contact number available for                            emergencies. The signs and symptoms of potential                            delayed complications were discussed with the  patient. Return to normal activities tomorrow.                            Written discharge instructions were provided to the                            patient.                           - Resume previous diet.                           - Continue present medications.                           - Has responded to bid PPI so stay on that - has                            atypical back pain sxs as a meanifestation of reflux                           F/u PCP and see GI prn Iva Boop, MD 01/02/2020 2:36:26 PM This report has been signed electronically.

## 2020-01-04 ENCOUNTER — Telehealth: Payer: Self-pay

## 2020-01-04 ENCOUNTER — Telehealth: Payer: Self-pay | Admitting: *Deleted

## 2020-01-04 NOTE — Telephone Encounter (Signed)
  Follow up Call-  Call back number 01/02/2020  Post procedure Call Back phone  # [phone]  Permission to leave phone message Yes  Some recent data might be hidden     Patient questions:  Do you have a fever, pain , or abdominal swelling? No. Pain Score  0 *  Have you tolerated food without any problems? Yes.    Have you been able to return to your normal activities? Yes.    Do you have any questions about your discharge instructions: Diet   No. Medications  No. Follow up visit  No.  Do you have questions or concerns about your Care? No.  Actions: * If pain score is 4 or above: No action needed, pain <4.  1. Have you developed a fever since your procedure? no  2.   Have you had an respiratory symptoms (SOB or cough) since your procedure? no  3.   Have you tested positive for COVID 19 since your procedure no  4.   Have you had any family members/close contacts diagnosed with the COVID 19 since your procedure?  no   If yes to any of these questions please route to Laverna Peace, RN and Karlton Lemon, RN

## 2020-01-04 NOTE — Telephone Encounter (Signed)
Attempted f/u phone call. No answer. Left message. °

## 2020-01-17 ENCOUNTER — Telehealth (HOSPITAL_COMMUNITY): Payer: Self-pay | Admitting: Psychiatry

## 2020-01-17 NOTE — Telephone Encounter (Signed)
Called patient to schedule follow up appt., left detailed voice message

## 2020-02-12 ENCOUNTER — Other Ambulatory Visit: Payer: Self-pay | Admitting: Family Medicine

## 2020-02-13 NOTE — Telephone Encounter (Signed)
[name] NTBS 30 days given 12/28/19

## 2020-02-22 ENCOUNTER — Other Ambulatory Visit: Payer: Self-pay | Admitting: Family Medicine

## 2020-02-22 NOTE — Telephone Encounter (Signed)
  Prescription Request  02/22/2020  What is the name of the medication or equipment? Meloxicam  Have you contacted your pharmacy to request a refill? (if applicable) Yes  Which [facility] would you [facility] to[facility]  Pts mom says that pt really needs refill on his Meloxicam Rx. Says they were unaware that pt was supposed to make an appt to see Dr [name] when last refill was given. Mom scheduled pt to see Dr [name] on 03/04/20 (first available appt slot) but needs enough of the med sent to pharmacy to last pt until he can come in.   Patient notified that their request is being sent to the clinical staff for review and that they should receive a response within 2 business days.

## 2020-03-03 ENCOUNTER — Encounter: Payer: Self-pay | Admitting: Family Medicine

## 2020-03-03 ENCOUNTER — Ambulatory Visit (INDEPENDENT_AMBULATORY_CARE_PROVIDER_SITE_OTHER): Payer: Medicaid Other | Admitting: Family Medicine

## 2020-03-03 DIAGNOSIS — K219 Gastro-esophageal reflux disease without esophagitis: Secondary | ICD-10-CM

## 2020-03-03 DIAGNOSIS — M2550 Pain in unspecified joint: Secondary | ICD-10-CM | POA: Diagnosis not present

## 2020-03-03 DIAGNOSIS — E559 Vitamin D deficiency, unspecified: Secondary | ICD-10-CM | POA: Diagnosis not present

## 2020-03-03 DIAGNOSIS — R5383 Other fatigue: Secondary | ICD-10-CM | POA: Diagnosis not present

## 2020-03-03 MED ORDER — MELOXICAM 15 MG PO TABS: 15.0000 mg | ORAL_TABLET | Freq: Every day | ORAL | 3 refills | Status: DC

## 2020-03-03 NOTE — Progress Notes (Signed)
Virtual Visit via telephone Note  I connected with MARCHELLO [name] on 03/03/20 at 1116 by telephone and verified that I am speaking with the correct person using two identifiers. Ryan [name] is currently located at home and patient are currently with her during visit. The provider, Fransisca Kaufmann Gizell Danser, MD is located in their office at time of visit.  Call ended at [phone]  I discussed the limitations, risks, security and privacy concerns of performing an evaluation and management service by telephone and the availability of in person appointments. I also discussed with the patient that there may be a patient responsible charge related to this service. The patient expressed understanding and agreed to proceed.   History and Present Illness: Patient went to see the rheumatologist and they did not find any rheumatological illness.  The vitamin D from rheumatology did help the joint pain.  He was taking meloxicam and it came back when he ran out.  He also got the covid like symptoms when this started. He has a hard time telling if it has lessened this year or not.   GERD Patient is currently on pantaprazole.  She denies any major symptoms or abdominal pain or belching or burping. She denies any blood in her stool or lightheadedness or dizziness.   No diagnosis found.    Review of Systems  Constitutional: Negative for chills and fever.  Eyes: Negative for visual disturbance.  Respiratory: Negative for shortness of breath and wheezing.   Cardiovascular: Negative for chest pain and leg swelling.  Musculoskeletal: Negative for back pain and gait problem.  Skin: Negative for rash.  Neurological: Negative for dizziness, weakness and light-headedness.  All other systems reviewed and are negative.   Observations/Objective: Patient sounds comfortable and in no acute distress  Assessment and Plan: Problem List Items Addressed This Visit      Digestive   GERD (gastroesophageal reflux disease) -  Primary   Relevant Orders   CBC with Differential/Platelet   CMP14+EGFR   Lipid panel     Other   Vitamin D deficiency   Relevant Orders   VITAMIN D 25 Hydroxy (Vit-D Deficiency, Fractures)    Other Visit Diagnoses    Arthralgia of multiple joints       Relevant Medications   meloxicam (MOBIC) 15 MG tablet   Fatigue, unspecified type       Relevant Medications   meloxicam (MOBIC) 15 MG tablet      Refill the meloxicam, he seems to be doing well on it, will continue forward with it for now and will watch for inflammatory, may be like he thinks of postinflammatory viral syndrome Follow up plan: Return in about 1 year (around 03/03/2021), or if symptoms worsen or fail to improve.     I discussed the assessment and treatment plan with the patient. The patient was provided an opportunity to ask questions and all were answered. The patient agreed with the plan and demonstrated an understanding of the instructions.   The patient was advised to call back or seek an in-person evaluation if the symptoms worsen or if the condition fails to improve as anticipated.  The above assessment and management plan was discussed with the patient. The patient verbalized understanding of and has agreed to the management plan. Patient is aware to call the clinic if symptoms persist or worsen. Patient is aware when to return to the clinic for a follow-up visit. Patient educated on when it is appropriate to go to the emergency  department.    I provided 12 minutes of non-face-to-face time during this encounter.    Worthy Rancher, MD

## 2020-03-25 ENCOUNTER — Telehealth (HOSPITAL_COMMUNITY): Payer: Self-pay | Admitting: *Deleted

## 2020-03-25 ENCOUNTER — Other Ambulatory Visit (HOSPITAL_COMMUNITY): Payer: Self-pay | Admitting: Psychiatry

## 2020-03-25 MED ORDER — LISDEXAMFETAMINE DIMESYLATE 60 MG PO CAPS
60.0000 mg | ORAL_CAPSULE | ORAL | 0 refills | Status: DC
Start: 2020-03-25 — End: 2020-05-27

## 2020-03-25 NOTE — Telephone Encounter (Signed)
Patient mother called stating she is needing refills for patient Vyvanse [facility] to [facility] off of [address]

## 2020-03-25 NOTE — Telephone Encounter (Signed)
noted 

## 2020-03-25 NOTE — Telephone Encounter (Signed)
sent 

## 2020-05-24 ENCOUNTER — Other Ambulatory Visit (HOSPITAL_COMMUNITY): Payer: Self-pay | Admitting: Psychiatry

## 2020-05-26 ENCOUNTER — Telehealth: Payer: Self-pay

## 2020-05-26 NOTE — Telephone Encounter (Signed)
REFERRAL REQUEST Telephone Note  Have you been seen at our office for this problem? No, talk to [name] on the phone (Advise that they may need an appointment with their PCP before a referral can be done)  Reason for Referral: long covid related issues Referral discussed with patient: yes, on the phone Best contact number of patient for referral team:  #[phone]  Has patient been seen by a specialist for this issue before:  Patient provider preference for referral: [facility] at [facility]? Patient location preference for referral:    Patient notified that referrals can take up to a week or longer to process. If they haven't heard anything within a week they should call back and speak with the referral department.   Please call pt or call his mom #[phone].  [name]'s pt.

## 2020-05-26 NOTE — Telephone Encounter (Signed)
Please schedule virtual appointment with the patient on my schedule

## 2020-05-27 ENCOUNTER — Encounter (HOSPITAL_COMMUNITY): Payer: Self-pay | Admitting: Psychiatry

## 2020-05-27 ENCOUNTER — Telehealth (INDEPENDENT_AMBULATORY_CARE_PROVIDER_SITE_OTHER): Payer: Medicaid Other | Admitting: Psychiatry

## 2020-05-27 ENCOUNTER — Other Ambulatory Visit: Payer: Self-pay

## 2020-05-27 DIAGNOSIS — F3162 Bipolar disorder, current episode mixed, moderate: Secondary | ICD-10-CM | POA: Diagnosis not present

## 2020-05-27 DIAGNOSIS — F900 Attention-deficit hyperactivity disorder, predominantly inattentive type: Secondary | ICD-10-CM

## 2020-05-27 MED ORDER — HYDROXYZINE PAMOATE 100 MG PO CAPS: 100.0000 mg | ORAL_CAPSULE | Freq: Three times a day (TID) | ORAL | 2 refills | Status: DC | PRN

## 2020-05-27 MED ORDER — TRAZODONE HCL 100 MG PO TABS: 100.0000 mg | ORAL_TABLET | Freq: Every day | ORAL | 2 refills | Status: DC

## 2020-05-27 MED ORDER — LISDEXAMFETAMINE DIMESYLATE 60 MG PO CAPS: 60.0000 mg | ORAL_CAPSULE | ORAL | 0 refills | Status: DC

## 2020-05-27 MED ORDER — PAROXETINE HCL 40 MG PO TABS
40.0000 mg | ORAL_TABLET | Freq: Every day | ORAL | 2 refills | Status: DC
Start: 2020-05-27 — End: 2020-09-18

## 2020-05-27 NOTE — Progress Notes (Signed)
Virtual Visit via Video Note  I connected with Ryan [name] on 05/27/20 at  3:00 PM EST by a video enabled telemedicine application and verified that I am speaking with the correct person using two identifiers.  Location: Patient: home Provider: home   I discussed the limitations of evaluation and management by telemedicine and the availability of in person appointments. The patient expressed understanding and agreed to proceed    I discussed the assessment and treatment plan with the patient. The patient was provided an opportunity to ask questions and all were answered. The patient agreed with the plan and demonstrated an understanding of the instructions.   The patient was advised to call back or seek an in-person evaluation if the symptoms worsen or if the condition fails to improve as anticipated.  I provided 15 minutes of non-face-to-face time during this encounter.   [name], MD  [facility] MD/PA/NP OP Progress Note  05/27/2020 3:22 PM Ryan [name]  MRN:  [id number]  Chief Complaint:  Chief Complaint    ADHD; Anxiety; Depression; Follow-up     HPI: This patient is a 25 year old single white male who lives with his family in [place].  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  The patient returns for follow-up after about 6 months.  He states that he is generally doing okay.  He is enjoying his job.  He states that some of the symptoms he had about a year ago such as malaise and severe fatigue have come back he is also having hot flashes.  He had a big work-up at one point the check for all sorts of autoimmune disorders but the only thing that was found was low vitamin D.  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  He has never had the test for the antibodies.  He is going to be going to [facility] for some further  evaluation.  For now however he thinks that his current medications are helping particular the Vyvanse and staying focused.  He is sleeping fairly well most of the time.  The hydroxyzine continues to help with his anxiety.  He denies being depressed or suicidal or overly anxious. Visit Diagnosis: ADHD, bipolar disorder Past Psychiatric History: Long-term outpatient treatment  Past Medical History:  Past Medical History:  Diagnosis Date  . Allergy    seasonal  . Anxiety   . Bipolar affective disorder ([place])   . Depression   . GERD (gastroesophageal reflux disease)   . Seizures ([place])    seizures began in NOV 2013 ,last was 2014    Past Surgical History:  Procedure Laterality Date  . TONSILLECTOMY      Family Psychiatric History: see below  Family History:  Family History  Problem Relation Age of Onset  . ADD / ADHD Father   . Mood Disorder Father   . ADD / ADHD Sister   . Mood Disorder Sister   . Bipolar disorder Sister   . ADD / ADHD Brother   . Mood Disorder Brother   . Bipolar disorder Brother   . Depression Maternal Grandfather   . Mood Disorder Paternal Grandfather   . Mood Disorder Paternal Grandmother   . ADD / ADHD Sister   . Mood Disorder Sister   . Bipolar disorder Sister   . ADD / ADHD  Brother   . Mood Disorder Brother   . Bipolar disorder Brother     Social History:  Social History   Socioeconomic History  . Marital status: Single    Spouse name: Not on file  . Number of children: Not on file  . Years of education: Not on file  . Highest education level: Not on file  Occupational History  . Not on file  Tobacco Use  . Smoking status: Never Smoker  . Smokeless tobacco: Never Used  Vaping Use  . Vaping Use: Never used  Substance and Sexual Activity  . Alcohol use: No    Alcohol/week: 0.0 standard drinks    Comment: 12-17-2015 per pt no   . Drug use: No    Comment: 12-17-15 per pt no   . Sexual activity: Never  Other Topics Concern  . Not on  file  Social History Narrative  . Not on file   Social Determinants of Health   Financial Resource Strain: Not on file  Food Insecurity: Not on file  Transportation Needs: Not on file  Physical Activity: Not on file  Stress: Not on file  Social Connections: Not on file    Allergies:  Allergies  Allergen Reactions  . Sulfa Antibiotics Rash    Metabolic Disorder Labs: Lab Results  Component Value Date   HGBA1C 5.3 12/25/2018   No results found for: PROLACTIN No results found for: CHOL, TRIG, HDL, CHOLHDL, VLDL, LDLCALC Lab Results  Component Value Date   TSH 1.610 02/06/2019   TSH 1.490 12/25/2018    Therapeutic Level Labs: No results found for: LITHIUM Lab Results  Component Value Date   VALPROATE 106 (H) 05/18/2013   VALPROATE 64.9 03/10/2012   No components found for:  CBMZ  Current Medications: Current Outpatient Medications  Medication Sig Dispense Refill  . lisdexamfetamine (VYVANSE) 60 MG capsule Take 1 capsule (60 mg total) by mouth every morning. 30 capsule 0  . lisdexamfetamine (VYVANSE) 60 MG capsule Take 1 capsule (60 mg total) by mouth every morning. 30 capsule 0  . Accu-Chek Softclix Lancets lancets Check BS QID Dx E11.9 100 each 5  . blood glucose meter kit and supplies Dispense based on patient and insurance preference. Use up to four times daily as directed. (FOR ICD-10 E10.9, E11.9). 1 each 0  . Cholecalciferol (VITAMIN D3) 125 MCG (5000 UT) CAPS TAKE ONE CAPSULE BY MOUTH DAILY 100 capsule 0  . glucose blood (ACCU-CHEK GUIDE) test strip Check BS QID Dx E11.9 100 each 5  . hydrOXYzine (VISTARIL) 100 MG capsule Take 1 capsule (100 mg total) by mouth 3 (three) times daily as needed. 90 capsule 2  . Lancets Misc. (ACCU-CHEK FASTCLIX LANCET) KIT Check BS QID Dx E11.9 1 kit 0  . lisdexamfetamine (VYVANSE) 60 MG capsule Take 1 capsule (60 mg total) by mouth every morning. 30 capsule 0  . meloxicam (MOBIC) 15 MG tablet Take 1 tablet (15 mg total) by mouth  daily. 90 tablet 3  . pantoprazole (PROTONIX) 40 MG tablet Take 1 tablet (40 mg total) by mouth 2 (two) times daily. 60 tablet 11  . PARoxetine (PAXIL) 40 MG tablet Take 1 tablet (40 mg total) by mouth daily. 30 tablet 2  . traZODone (DESYREL) 100 MG tablet Take 1 tablet (100 mg total) by mouth at bedtime. 30 tablet 2   No current facility-administered medications for this visit.     Musculoskeletal: Strength & Muscle Tone: within normal limits Gait & Station: normal Patient leans:  N/A  Psychiatric Specialty Exam: Review of Systems  Constitutional: Positive for fatigue.  All other systems reviewed and are negative.   There were no vitals taken for this visit.There is no height or weight on file to calculate BMI.  General Appearance: Casual and Fairly Groomed  Eye Contact:  Good  Speech:  Clear and Coherent  Volume:  Normal  Mood:  Euthymic  Affect:  Appropriate and Congruent  Thought Process:  Goal Directed  Orientation:  Full (Time, Place, and Person)  Thought Content: WDL   Suicidal Thoughts:  No  Homicidal Thoughts:  No  Memory:  Immediate;   Good Recent;   Good Remote;   Fair  Judgement:  Good  Insight:  Good  Psychomotor Activity:  Decreased  Concentration:  Concentration: Good and Attention Span: Good  Recall:  Good  Fund of Knowledge: Good  Language: Good  Akathisia:  No  Handed:  Right  AIMS (if indicated): not done  Assets:  Communication Skills Desire for Improvement Physical Health Resilience Social Support Talents/Skills  ADL's:  Intact  Cognition: WNL  Sleep:  Good   Screenings: PHQ2-9   [place] Office Visit from 12/25/2018 in [place] Visit from 04/08/2017 in [place] Office Visit from 11/10/2016 in [place] Office Visit from 09/28/2016 in [place] Office Visit from 12/12/2015 in [place]  PHQ-2 Total Score 0 0  0 1 0       Assessment and Plan: This patient is a 25 year old male with a history of depression anxiety and ADHD.  He still having some episodes of fatigue and hot flashes which she attributes to possible long Covid.  It is difficult to tell at this point.  However he does think his psychiatric medications are helping with his mood anxiety and focus.  He will continue Vyvanse 60 mg daily for focus, Vistaril 100 mg up to 3 times daily for anxiety, Paxil 40 mg daily for depression and trazodone 100 mg at bedtime for sleep.  He will return to see me in 3 months   [name], MD 05/27/2020, 3:22 PM

## 2020-06-04 NOTE — Telephone Encounter (Signed)
Spoke with mom, scheduled telephone visit with Dr. Louanne Skye on 06/09/2020.

## 2020-06-09 ENCOUNTER — Encounter: Payer: Self-pay | Admitting: Family Medicine

## 2020-06-09 ENCOUNTER — Ambulatory Visit (INDEPENDENT_AMBULATORY_CARE_PROVIDER_SITE_OTHER): Payer: Medicaid Other | Admitting: Family Medicine

## 2020-06-09 DIAGNOSIS — M1990 Unspecified osteoarthritis, unspecified site: Secondary | ICD-10-CM | POA: Diagnosis not present

## 2020-06-09 DIAGNOSIS — R5383 Other fatigue: Secondary | ICD-10-CM

## 2020-06-09 DIAGNOSIS — U099 Post covid-19 condition, unspecified: Secondary | ICD-10-CM

## 2020-06-09 NOTE — Progress Notes (Signed)
Virtual Visit via telephone Note  I connected with Ryan [name] on 06/09/20 at 0912 by telephone and verified that I am speaking with the correct person using two identifiers. Ryan [name] is currently located at home and patient are currently with her during visit. The provider, Fransisca Kaufmann Deliliah Spranger, MD is located in their office at time of visit.  Call ended at [phone]  I discussed the limitations, risks, security and privacy concerns of performing an evaluation and management service by telephone and the availability of in person appointments. I also discussed with the patient that there may be a patient responsible charge related to this service. The patient expressed understanding and agreed to proceed.   History and Present Illness: Patient is calling in for long term covid effects.  His symptoms had improved and now have worsened and returned back to where they were. He is having fatigue and inflammation of hands and joints. He started getting worse in December.  The inflammation in hands and joints started first. The fatigue started up shortly after. Still using meloxicam and it helps some but pain is still present most of the time.   He sometimes feels chest tightness and palpitations  No diagnosis found.  Outpatient Encounter Medications as of 06/09/2020  Medication Sig  . Accu-Chek Softclix Lancets lancets Check BS QID Dx E11.9  . blood glucose meter kit and supplies Dispense based on patient and insurance preference. Use up to four times daily as directed. (FOR ICD-10 E10.9, E11.9).  Marland Kitchen Cholecalciferol (VITAMIN D3) 125 MCG (5000 UT) CAPS TAKE ONE CAPSULE BY MOUTH DAILY  . glucose blood (ACCU-CHEK GUIDE) test strip Check BS QID Dx E11.9  . hydrOXYzine (VISTARIL) 100 MG capsule Take 1 capsule (100 mg total) by mouth 3 (three) times daily as needed.  . Lancets Misc. (ACCU-CHEK FASTCLIX LANCET) KIT Check BS QID Dx E11.9  . lisdexamfetamine (VYVANSE) 60 MG capsule Take 1 capsule (60 mg  total) by mouth every morning.  . lisdexamfetamine (VYVANSE) 60 MG capsule Take 1 capsule (60 mg total) by mouth every morning.  . lisdexamfetamine (VYVANSE) 60 MG capsule Take 1 capsule (60 mg total) by mouth every morning.  . meloxicam (MOBIC) 15 MG tablet Take 1 tablet (15 mg total) by mouth daily.  . pantoprazole (PROTONIX) 40 MG tablet Take 1 tablet (40 mg total) by mouth 2 (two) times daily.  Marland Kitchen PARoxetine (PAXIL) 40 MG tablet Take 1 tablet (40 mg total) by mouth daily.  . traZODone (DESYREL) 100 MG tablet Take 1 tablet (100 mg total) by mouth at bedtime.   No facility-administered encounter medications on file as of 06/09/2020.    Review of Systems  Constitutional: Positive for fatigue. Negative for chills and fever.  Eyes: Negative for visual disturbance.  Respiratory: Negative for shortness of breath and wheezing.   Cardiovascular: Negative for chest pain and leg swelling.  Musculoskeletal: Positive for arthralgias and joint swelling. Negative for back pain and gait problem.  Skin: Negative for rash.  Neurological: Negative for dizziness, weakness, light-headedness and numbness.  All other systems reviewed and are negative.   Observations/Objective: Patient sounds comfortable and in no acute distress  Assessment and Plan: Problem List Items Addressed This Visit   None   Visit Diagnoses    Long COVID    -  Primary   Relevant Orders   Ambulatory referral to Immunology   Joint inflammation       Relevant Orders   Ambulatory referral to Immunology  Fatigue, unspecified type       Relevant Orders   Ambulatory referral to Immunology    Patient has seen an endocrinologist and a rheumatologist here, it still seems like this all started after Covid and neither the rheumatologist or endocrinologist have given any sure answers, will refer [facility] an [facility]ologist at [facility]  Follow up plan: Return if symptoms worsen or fail to improve.     I discussed the assessment and treatment  plan with the patient. The patient was provided an opportunity to ask questions and all were answered. The patient agreed with the plan and demonstrated an understanding of the instructions.   The patient was advised to call back or seek an in-person evaluation if the symptoms worsen or if the condition fails to improve as anticipated.  The above assessment and management plan was discussed with the patient. The patient verbalized understanding of and has agreed to the management plan. Patient is aware to call the clinic if symptoms persist or worsen. Patient is aware when to return to the clinic for a follow-up visit. Patient educated on when it is appropriate to go to the emergency department.    I provided 11 minutes of non-face-to-face time during this encounter.    Ryan Rancher, MD

## 2020-06-23 ENCOUNTER — Telehealth (HOSPITAL_COMMUNITY): Payer: Self-pay | Admitting: Psychiatry

## 2020-06-23 NOTE — Telephone Encounter (Signed)
Called to schedule f/u appt, unable to leave vm due to voice mailbox being full 

## 2020-06-29 IMAGING — CT CT ABD-PELV W/ CM
2 of 4 series · 17 of 46 positions shown, 19 images · IV contrast (APPLIED)
Comparison: None.

CLINICAL DATA: Abdominal pain and nausea. Suspected appendicitis.

EXAM:
CT ABDOMEN AND PELVIS WITH CONTRAST
TECHNIQUE: Multidetector CT imaging of the abdomen and pelvis was performed
using the standard protocol following bolus administration of
intravenous contrast.
CONTRAST:  100mL OMNIPAQUE IOHEXOL 300 MG/ML  SOLN

[Series 2: axial st · axial · 0.78mm/px · z∈[-484,-109]mm · 14 of 85 slices shown, 16 images]
[im 5/85  soft-tissue]
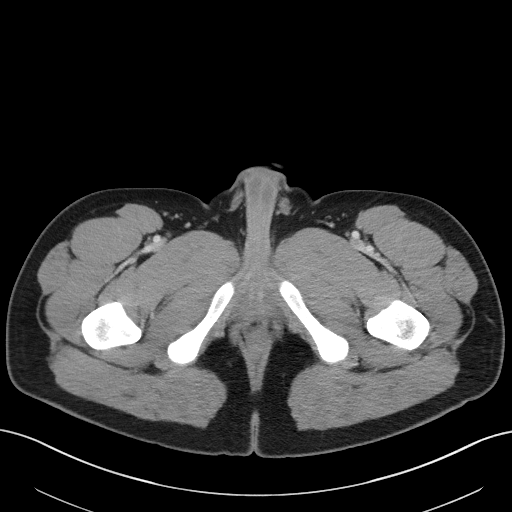
[im 5/85  bone]
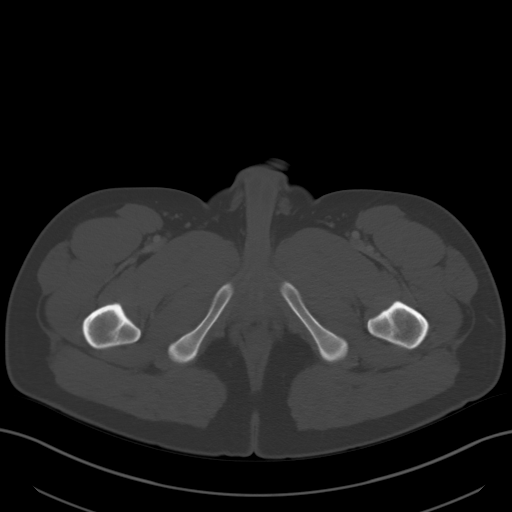
[im 9/85  soft-tissue]
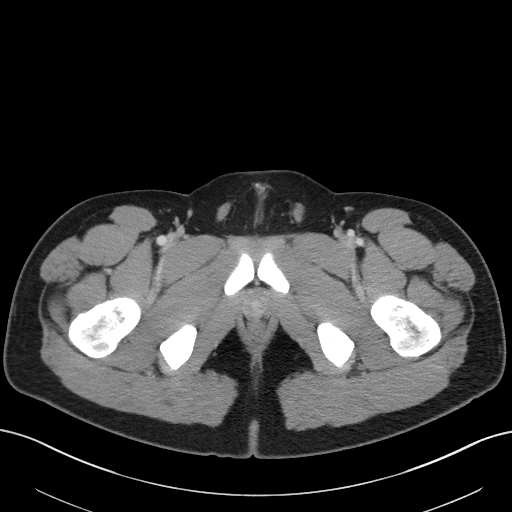
[im 18/85  soft-tissue]
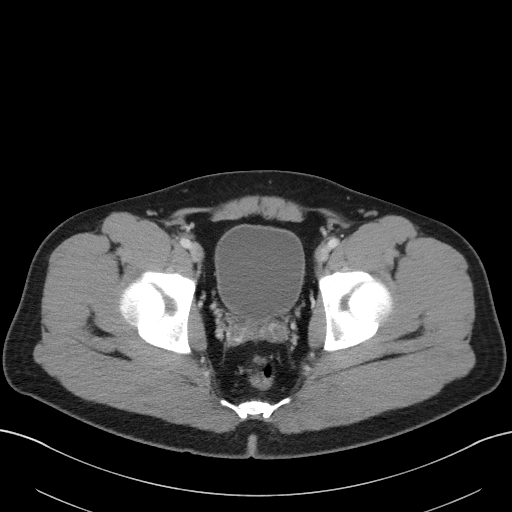
[im 23/85  soft-tissue]
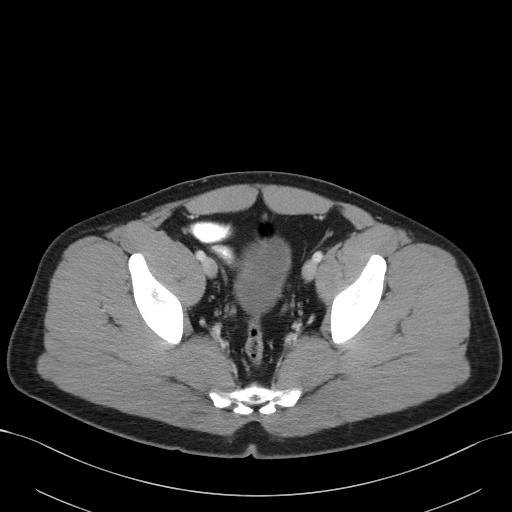
[im 27/85  soft-tissue]
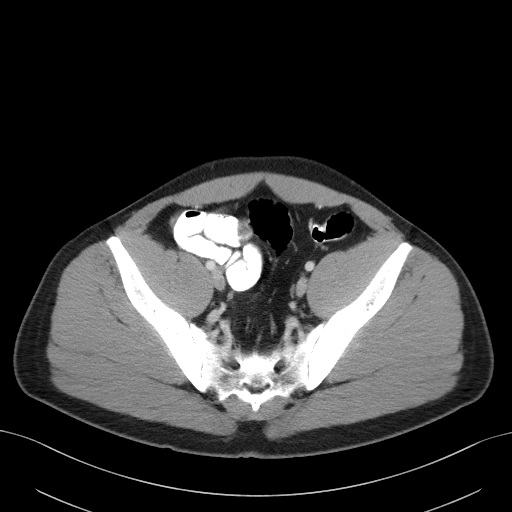
[im 36/85  soft-tissue]
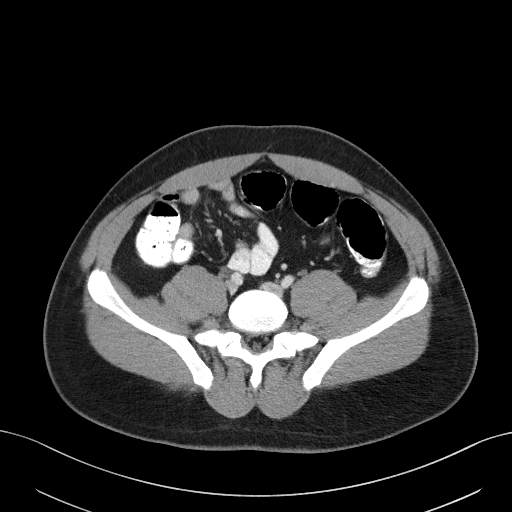
[im 40/85  soft-tissue]
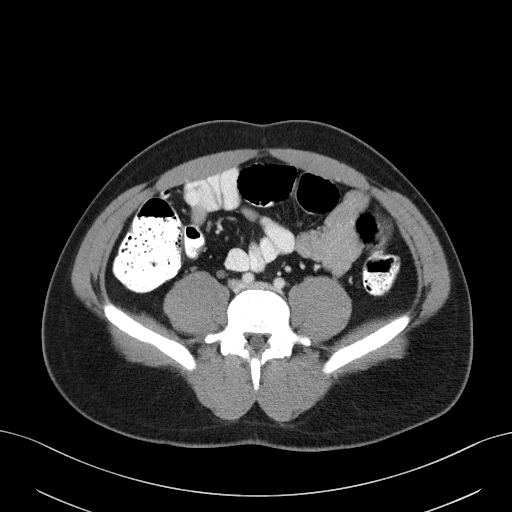
[im 45/85  soft-tissue]
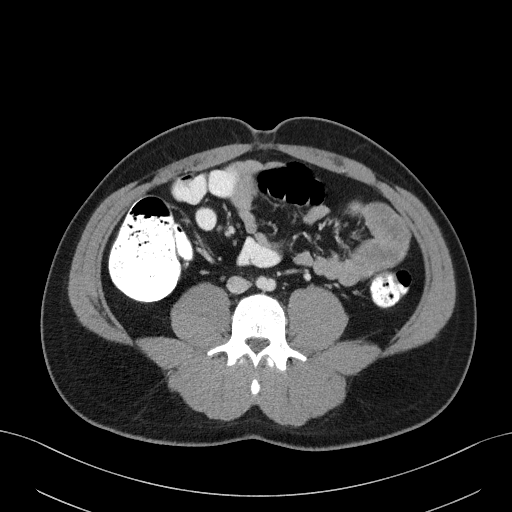
[im 49/85  soft-tissue]
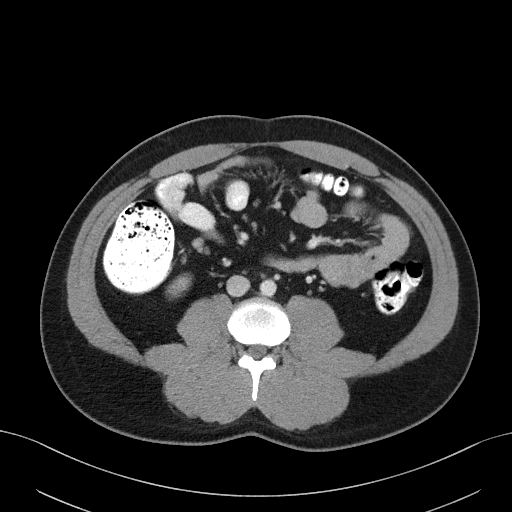
[im 49/85  bone]
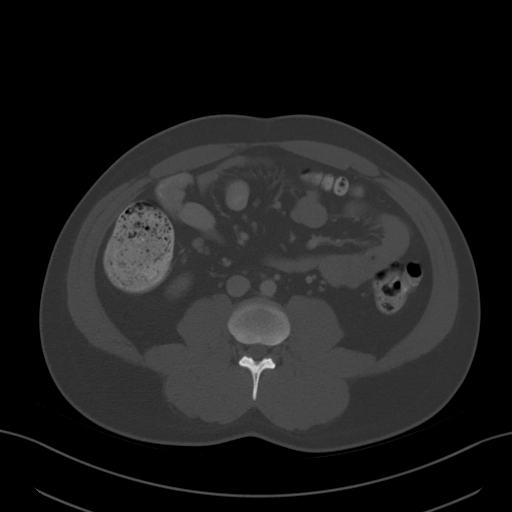
[im 58/85  soft-tissue]
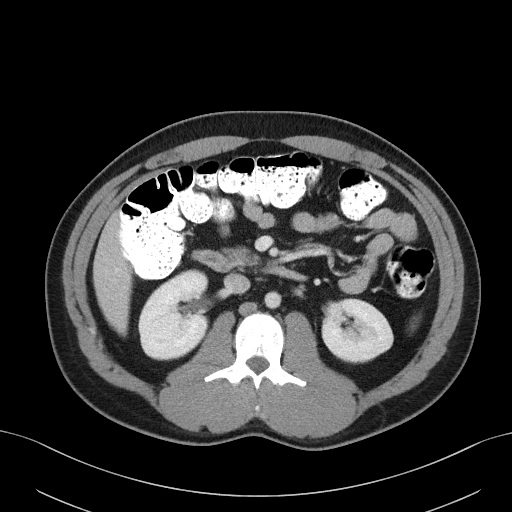
[im 62/85  soft-tissue]
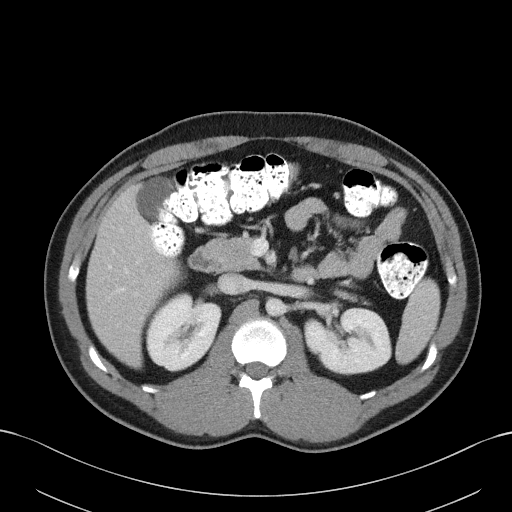
[im 67/85  soft-tissue]
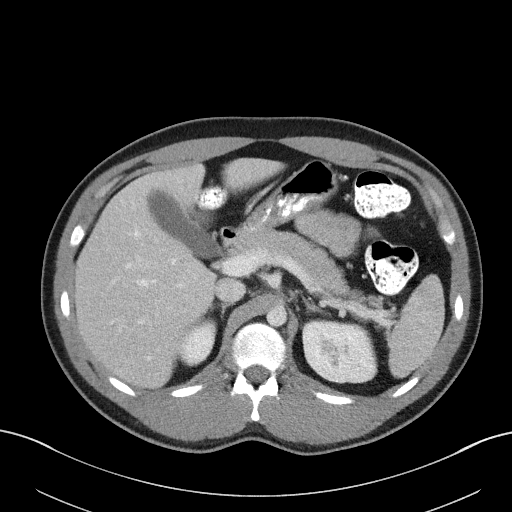
[im 76/85  soft-tissue]
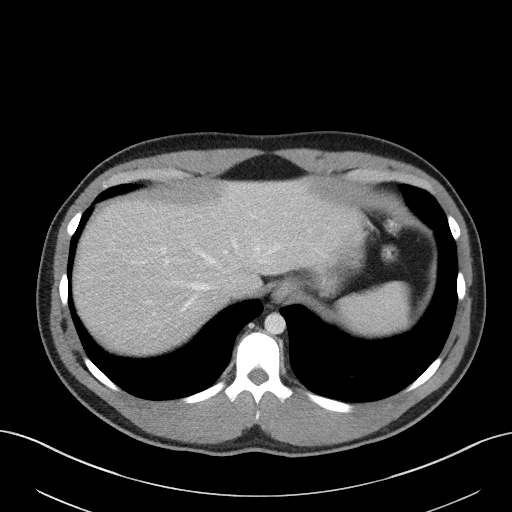
[im 80/85  soft-tissue]
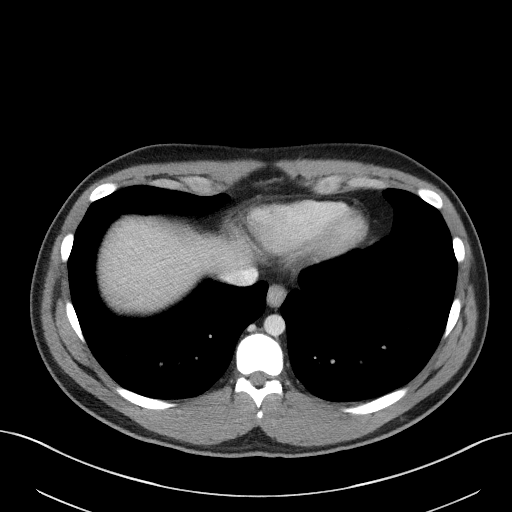

[Series 5: coronal st · coronal · 0.68mm/px · 3 of 87 slices shown]
[im 29/87  soft-tissue]
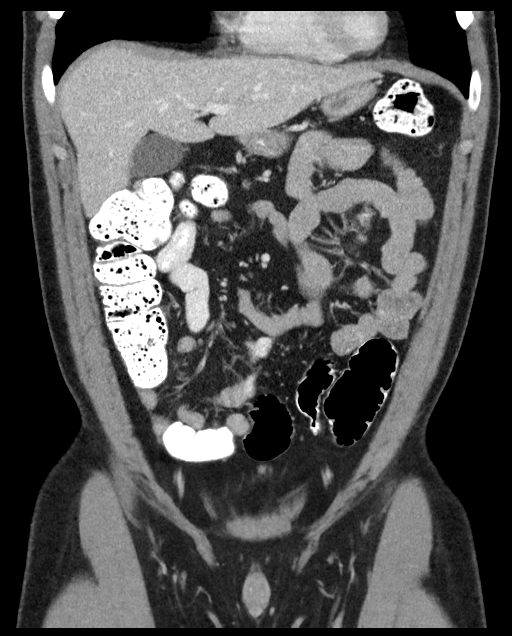
[im 39/87  soft-tissue]
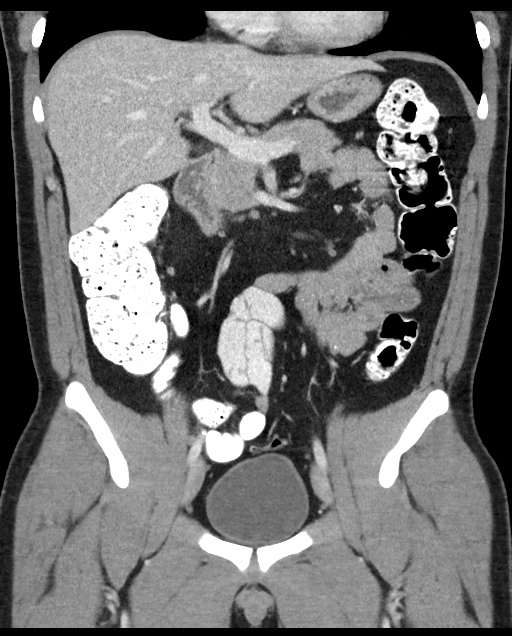
[im 48/87  soft-tissue]
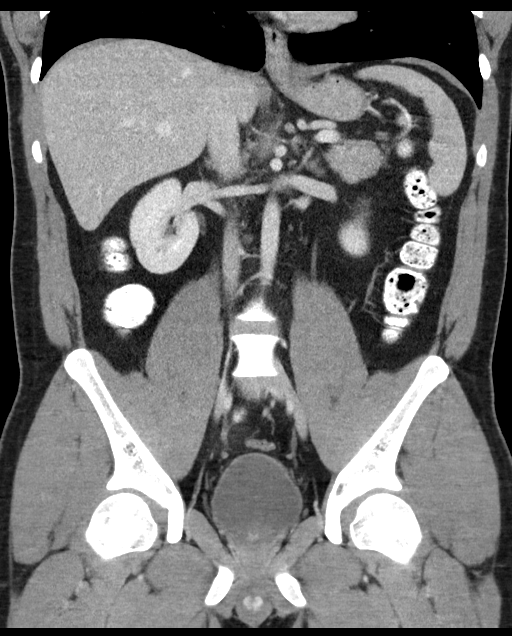

[17 of 46 positions shown; findings below may reference images not displayed]

FINDINGS: Lower Chest: No acute findings.

Hepatobiliary: No hepatic masses identified. Gallbladder is
unremarkable. No evidence of biliary ductal dilatation.

Pancreas:  No mass or inflammatory changes.

Spleen: Within normal limits in size and appearance.

Adrenals/Urinary Tract: No masses identified. No evidence of
hydronephrosis.

Stomach/Bowel: No evidence of obstruction, inflammatory process or
abnormal fluid collections. Normal appendix visualized.

Vascular/Lymphatic: No pathologically enlarged lymph nodes. No
abdominal aortic aneurysm.

Reproductive:  No mass or other significant abnormality.

Other:  None.

Musculoskeletal:  No suspicious bone lesions identified.
IMPRESSION: Negative. No evidence of appendicitis or other significant
abnormality.

## 2020-07-16 ENCOUNTER — Other Ambulatory Visit: Payer: Self-pay | Admitting: "Endocrinology

## 2020-09-15 ENCOUNTER — Other Ambulatory Visit (HOSPITAL_COMMUNITY): Payer: Self-pay | Admitting: Psychiatry

## 2020-09-15 ENCOUNTER — Telehealth (HOSPITAL_COMMUNITY): Payer: Self-pay | Admitting: *Deleted

## 2020-09-15 MED ORDER — LISDEXAMFETAMINE DIMESYLATE 60 MG PO CAPS: 60.0000 mg | ORAL_CAPSULE | ORAL | 0 refills | Status: DC

## 2020-09-15 NOTE — Telephone Encounter (Signed)
He said Amphetamine Salt.

## 2020-09-15 NOTE — Telephone Encounter (Signed)
Patient called stating he is needing refills. Patient appt is for this Thursday and only have 1 more tab left.

## 2020-09-15 NOTE — Telephone Encounter (Signed)
Of what ? He is on numerous meds

## 2020-09-15 NOTE — Telephone Encounter (Signed)
The only stimulant he is on is vyvanse so this is what I sent in

## 2020-09-16 NOTE — Telephone Encounter (Signed)
Spoke with patient and informed him with what provider stated and he verbalized understanding.  °

## 2020-09-18 ENCOUNTER — Telehealth (INDEPENDENT_AMBULATORY_CARE_PROVIDER_SITE_OTHER): Payer: Medicaid Other | Admitting: Psychiatry

## 2020-09-18 ENCOUNTER — Other Ambulatory Visit: Payer: Self-pay

## 2020-09-18 ENCOUNTER — Encounter (HOSPITAL_COMMUNITY): Payer: Self-pay | Admitting: Psychiatry

## 2020-09-18 DIAGNOSIS — F3162 Bipolar disorder, current episode mixed, moderate: Secondary | ICD-10-CM | POA: Diagnosis not present

## 2020-09-18 DIAGNOSIS — F900 Attention-deficit hyperactivity disorder, predominantly inattentive type: Secondary | ICD-10-CM | POA: Diagnosis not present

## 2020-09-18 MED ORDER — VITAMIN D3 125 MCG (5000 UT) PO CAPS: 1.0000 | ORAL_CAPSULE | Freq: Every day | ORAL | 0 refills | Status: DC

## 2020-09-18 MED ORDER — HYDROXYZINE PAMOATE 100 MG PO CAPS: 100.0000 mg | ORAL_CAPSULE | Freq: Three times a day (TID) | ORAL | 2 refills | Status: DC | PRN

## 2020-09-18 MED ORDER — LISDEXAMFETAMINE DIMESYLATE 60 MG PO CAPS: 60.0000 mg | ORAL_CAPSULE | ORAL | 0 refills | Status: DC

## 2020-09-18 MED ORDER — PAROXETINE HCL 40 MG PO TABS: 40.0000 mg | ORAL_TABLET | Freq: Every day | ORAL | 2 refills | Status: DC

## 2020-09-18 MED ORDER — TRAZODONE HCL 100 MG PO TABS: 100.0000 mg | ORAL_TABLET | Freq: Every day | ORAL | 2 refills | Status: DC

## 2020-09-18 NOTE — Progress Notes (Deleted)
[facility] MD/PA/NP OP Progress Note  09/18/2020 2:00 PM Ryan [name]  MRN:  [id number]  Chief Complaint:  Chief Complaint    Anxiety; Depression; ADHD; Fatigue     HPI: *** Visit Diagnosis:    ICD-10-CM   1. Attention deficit hyperactivity disorder (ADHD), predominantly inattentive type  F90.0   2. Bipolar 1 disorder, mixed, moderate (HCC)  F31.62     Past Psychiatric History: ***  Past Medical History:  Past Medical History:  Diagnosis Date  . Allergy    seasonal  . Anxiety   . Bipolar affective disorder ([place])   . Depression   . GERD (gastroesophageal reflux disease)   . Seizures ([place])    seizures began in NOV 2013 ,last was 2014    Past Surgical History:  Procedure Laterality Date  . TONSILLECTOMY      Family Psychiatric History: ***  Family History:  Family History  Problem Relation Age of Onset  . ADD / ADHD Father   . Mood Disorder Father   . ADD / ADHD Sister   . Mood Disorder Sister   . Bipolar disorder Sister   . ADD / ADHD Brother   . Mood Disorder Brother   . Bipolar disorder Brother   . Depression Maternal Grandfather   . Mood Disorder Paternal Grandfather   . Mood Disorder Paternal Grandmother   . ADD / ADHD Sister   . Mood Disorder Sister   . Bipolar disorder Sister   . ADD / ADHD Brother   . Mood Disorder Brother   . Bipolar disorder Brother     Social History:  Social History   Socioeconomic History  . Marital status: Single    Spouse name: Not on file  . Number of children: Not on file  . Years of education: Not on file  . Highest education level: Not on file  Occupational History  . Not on file  Tobacco Use  . Smoking status: Never Smoker  . Smokeless tobacco: Never Used  Vaping Use  . Vaping Use: Never used  Substance and Sexual Activity  . Alcohol use: No    Alcohol/week: 0.0 standard drinks    Comment: 12-17-2015 per pt no   . Drug use: No    Comment: 12-17-15 per pt no   . Sexual activity: Never  Other Topics Concern  .  Not on file  Social History Narrative  . Not on file   Social Determinants of Health   Financial Resource Strain: Not on file  Food Insecurity: Not on file  Transportation Needs: Not on file  Physical Activity: Not on file  Stress: Not on file  Social Connections: Not on file    Allergies:  Allergies  Allergen Reactions  . Sulfa Antibiotics Rash    Metabolic Disorder Labs: Lab Results  Component Value Date   HGBA1C 5.3 12/25/2018   No results found for: PROLACTIN No results found for: CHOL, TRIG, HDL, CHOLHDL, VLDL, LDLCALC Lab Results  Component Value Date   TSH 1.610 02/06/2019   TSH 1.490 12/25/2018    Therapeutic Level Labs: No results found for: LITHIUM Lab Results  Component Value Date   VALPROATE 106 (H) 05/18/2013   VALPROATE 64.9 03/10/2012   No components found for:  CBMZ  Current Medications: Current Outpatient Medications  Medication Sig Dispense Refill  . Accu-Chek Softclix Lancets lancets Check BS QID Dx E11.9 100 each 5  . blood glucose meter kit and supplies Dispense based on patient and insurance preference. Use  up to four times daily as directed. (FOR ICD-10 E10.9, E11.9). 1 each 0  . Cholecalciferol (VITAMIN D3) 125 MCG (5000 UT) CAPS Take 1 capsule (5,000 Units total) by mouth daily. 100 capsule 0  . glucose blood (ACCU-CHEK GUIDE) test strip Check BS QID Dx E11.9 100 each 5  . hydrOXYzine (VISTARIL) 100 MG capsule Take 1 capsule (100 mg total) by mouth 3 (three) times daily as needed. 90 capsule 2  . Lancets Misc. (ACCU-CHEK FASTCLIX LANCET) KIT Check BS QID Dx E11.9 1 kit 0  . lisdexamfetamine (VYVANSE) 60 MG capsule Take 1 capsule (60 mg total) by mouth every morning. 30 capsule 0  . lisdexamfetamine (VYVANSE) 60 MG capsule Take 1 capsule (60 mg total) by mouth every morning. 30 capsule 0  . lisdexamfetamine (VYVANSE) 60 MG capsule Take 1 capsule (60 mg total) by mouth every morning. 30 capsule 0  . meloxicam (MOBIC) 15 MG tablet Take 1  tablet (15 mg total) by mouth daily. 90 tablet 3  . pantoprazole (PROTONIX) 40 MG tablet Take 1 tablet (40 mg total) by mouth 2 (two) times daily. 60 tablet 11  . PARoxetine (PAXIL) 40 MG tablet Take 1 tablet (40 mg total) by mouth daily. 30 tablet 2  . traZODone (DESYREL) 100 MG tablet Take 1 tablet (100 mg total) by mouth at bedtime. 30 tablet 2   No current facility-administered medications for this visit.     Musculoskeletal: Strength & Muscle Tone: {desc; muscle tone:32375} Gait & Station: {PE GAIT ED XBWI:20355} Patient leans: {Patient Leans:21022755}  Psychiatric Specialty Exam: Review of Systems  There were no vitals taken for this visit.There is no height or weight on file to calculate BMI.  General Appearance: {Appearance:22683}  Eye Contact:  {BHH EYE CONTACT:22684}  Speech:  {Speech:22685}  Volume:  {Volume (PAA):22686}  Mood:  {BHH MOOD:22306}  Affect:  {Affect (PAA):22687}  Thought Process:  {Thought Process (PAA):22688}  Orientation:  {BHH ORIENTATION (PAA):22689}  Thought Content: {Thought Content:22690}   Suicidal Thoughts:  {ST/HT (PAA):22692}  Homicidal Thoughts:  {ST/HT (PAA):22692}  Memory:  {BHH MEMORY:22881}  Judgement:  {Judgement (PAA):22694}  Insight:  {Insight (PAA):22695}  Psychomotor Activity:  {Psychomotor (PAA):22696}  Concentration:  {Concentration:21399}  Recall:  {BHH GOOD/FAIR/POOR:22877}  Fund of Knowledge: {BHH GOOD/FAIR/POOR:22877}  Language: {BHH GOOD/FAIR/POOR:22877}  Akathisia:  {BHH YES OR NO:22294}  Handed:  {Handed:22697}  AIMS (if indicated): {Desc; done/not:10129}  Assets:  {Assets (PAA):22698}  ADL's:  {BHH HRC'B:63845}  Cognition: {chl bhh cognition:304700322}  Sleep:  {BHH GOOD/FAIR/POOR:22877}   Screenings: PHQ2-9   Flowsheet Row Video Visit from 09/18/2020 in [place] Office Visit from 12/25/2018 in [place] Office Visit from 04/08/2017 in [place] Visit from 11/10/2016 in [place] Visit from 09/28/2016 in [place]  PHQ-2 Total Score 0 0 0 0 1    Flowsheet Row Video Visit from 09/18/2020 in [place] No Risk       Assessment and Plan: ***   [name], MD 09/18/2020, 2:00 PM

## 2020-09-18 NOTE — Progress Notes (Signed)
Virtual Visit via Video Note  I connected with Ryan [name] on 09/18/20 at  1:40 PM EDT by a video enabled telemedicine application and verified that I am speaking with the correct person using two identifiers.  Location: Patient: home Provider: home office   I discussed the limitations of evaluation and management by telemedicine and the availability of in person appointments. The patient expressed understanding and agreed to proceed.     I discussed the assessment and treatment plan with the patient. The patient was provided an opportunity to ask questions and all were answered. The patient agreed with the plan and demonstrated an understanding of the instructions.   The patient was advised to call back or seek an in-person evaluation if the symptoms worsen or if the condition fails to improve as anticipated.  I provided 15 minutes of non-face-to-face time during this encounter.   [name], MD  [facility] MD/PA/NP OP Progress Note  09/18/2020 2:00 PM Ryan [name]  MRN:  [id number]  Chief Complaint:  Chief Complaint    Anxiety; Depression; ADHD; Fatigue     HPI: This patient is a 25 year old single white male who lives with his family in [place]. 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  The patient returns for follow-up after about 4 months.  He states that he is still having symptoms of "long COVID."  He thinks he had COVID right at the beginning of the pandemic around early 2020.  The symptoms have waxed and waned but right now he still having a lot of muscle and joint pain fatigue shortness of breath and "feeling hot all the time."  He had a battery of labs done a couple of months ago and his vitamin D was quite low at 13.  Apparently his endocrinologist had prescribed vitamin D last year and it did help to some degree but he ran out.  I told him I would renew it for him for now.  He states that his mood is generally good he is not having  significant anxiety and is sleeping well most of the time.  The Vyvanse continues to help with his focus.  His primary doctor is going to try to refer him to a "long [place] clinic" at [facility]   Visit Diagnosis:    ICD-10-CM   1. Attention deficit hyperactivity disorder (ADHD), predominantly inattentive type  F90.0   2. Bipolar 1 disorder, mixed, moderate (HCC)  F31.62     Past Psychiatric History: Long-term outpatient treatment  Past Medical History:  Past Medical History:  Diagnosis Date  . Allergy    seasonal  . Anxiety   . Bipolar affective disorder ([place])   . Depression   . GERD (gastroesophageal reflux disease)   . Seizures ([place])    seizures began in NOV 2013 ,last was 2014    Past Surgical History:  Procedure Laterality Date  . TONSILLECTOMY      Family Psychiatric History: see below  Family History:  Family History  Problem Relation Age of Onset  . ADD / ADHD Father   . Mood Disorder Father   . ADD / ADHD Sister   . Mood Disorder Sister   . Bipolar disorder Sister   . ADD / ADHD Brother   . Mood Disorder Brother   . Bipolar disorder Brother   . Depression Maternal Grandfather   . Mood Disorder Paternal Grandfather   . Mood Disorder Paternal Grandmother   . ADD / ADHD  Sister   . Mood Disorder Sister   . Bipolar disorder Sister   . ADD / ADHD Brother   . Mood Disorder Brother   . Bipolar disorder Brother     Social History:  Social History   Socioeconomic History  . Marital status: Single    Spouse name: Not on file  . Number of children: Not on file  . Years of education: Not on file  . Highest education level: Not on file  Occupational History  . Not on file  Tobacco Use  . Smoking status: Never Smoker  . Smokeless tobacco: Never Used  Vaping Use  . Vaping Use: Never used  Substance and Sexual Activity  . Alcohol use: No    Alcohol/week: 0.0 standard drinks    Comment: 12-17-2015 per pt no   . Drug use: No    Comment: 12-17-15 per pt no   .  Sexual activity: Never  Other Topics Concern  . Not on file  Social History Narrative  . Not on file   Social Determinants of Health   Financial Resource Strain: Not on file  Food Insecurity: Not on file  Transportation Needs: Not on file  Physical Activity: Not on file  Stress: Not on file  Social Connections: Not on file    Allergies:  Allergies  Allergen Reactions  . Sulfa Antibiotics Rash    Metabolic Disorder Labs: Lab Results  Component Value Date   HGBA1C 5.3 12/25/2018   No results found for: PROLACTIN No results found for: CHOL, TRIG, HDL, CHOLHDL, VLDL, LDLCALC Lab Results  Component Value Date   TSH 1.610 02/06/2019   TSH 1.490 12/25/2018    Therapeutic Level Labs: No results found for: LITHIUM Lab Results  Component Value Date   VALPROATE 106 (H) 05/18/2013   VALPROATE 64.9 03/10/2012   No components found for:  CBMZ  Current Medications: Current Outpatient Medications  Medication Sig Dispense Refill  . Accu-Chek Softclix Lancets lancets Check BS QID Dx E11.9 100 each 5  . blood glucose meter kit and supplies Dispense based on patient and insurance preference. Use up to four times daily as directed. (FOR ICD-10 E10.9, E11.9). 1 each 0  . Cholecalciferol (VITAMIN D3) 125 MCG (5000 UT) CAPS Take 1 capsule (5,000 Units total) by mouth daily. 100 capsule 0  . glucose blood (ACCU-CHEK GUIDE) test strip Check BS QID Dx E11.9 100 each 5  . hydrOXYzine (VISTARIL) 100 MG capsule Take 1 capsule (100 mg total) by mouth 3 (three) times daily as needed. 90 capsule 2  . Lancets Misc. (ACCU-CHEK FASTCLIX LANCET) KIT Check BS QID Dx E11.9 1 kit 0  . lisdexamfetamine (VYVANSE) 60 MG capsule Take 1 capsule (60 mg total) by mouth every morning. 30 capsule 0  . lisdexamfetamine (VYVANSE) 60 MG capsule Take 1 capsule (60 mg total) by mouth every morning. 30 capsule 0  . lisdexamfetamine (VYVANSE) 60 MG capsule Take 1 capsule (60 mg total) by mouth every morning. 30  capsule 0  . meloxicam (MOBIC) 15 MG tablet Take 1 tablet (15 mg total) by mouth daily. 90 tablet 3  . pantoprazole (PROTONIX) 40 MG tablet Take 1 tablet (40 mg total) by mouth 2 (two) times daily. 60 tablet 11  . PARoxetine (PAXIL) 40 MG tablet Take 1 tablet (40 mg total) by mouth daily. 30 tablet 2  . traZODone (DESYREL) 100 MG tablet Take 1 tablet (100 mg total) by mouth at bedtime. 30 tablet 2   No current facility-administered  medications for this visit.     Musculoskeletal: Strength & Muscle Tone: within normal limits Gait & Station: normal Patient leans: N/A  Psychiatric Specialty Exam: Review of Systems  Constitutional: Positive for fatigue.  Respiratory: Positive for shortness of breath.   Musculoskeletal: Positive for joint swelling and myalgias.    There were no vitals taken for this visit.There is no height or weight on file to calculate BMI.  General Appearance: Casual and Fairly Groomed  Eye Contact:  Good  Speech:  Clear and Coherent  Volume:  Normal  Mood:  Euthymic  Affect:  Appropriate and Congruent  Thought Process:  Goal Directed  Orientation:  Full (Time, Place, and Person)  Thought Content: WDL   Suicidal Thoughts:  No  Homicidal Thoughts:  No  Memory:  Immediate;   Good Recent;   Good Remote;   Fair  Judgement:  Good  Insight:  Good  Psychomotor Activity:  Decreased  Concentration:  Concentration: Good and Attention Span: Good  Recall:  Good  Fund of Knowledge: Good  Language: Good  Akathisia:  No  Handed:  Right  AIMS (if indicated): not done  Assets:  Communication Skills Desire for Improvement Resilience Social Support Talents/Skills Vocational/Educational  ADL's:  Intact  Cognition: WNL  Sleep:  Good   Screenings: PHQ2-9   Flowsheet Row Video Visit from 09/18/2020 in [place] Office Visit from 12/25/2018 in [place] Office Visit from 04/08/2017 in [place] Office Visit from 11/10/2016 in [place] Visit from 09/28/2016 in [place]  PHQ-2 Total Score 0 0 0 0 1    Flowsheet Row Video Visit from 09/18/2020 in [place] No Risk       Assessment and Plan: This patient is a 25 year old male with a history of depression anxiety and ADHD.  He still having episodes of fatigue hot flashes body aches etc. which he attributes to long COVID.  His vitamin D was low so I will send this in for him until he can get back in with endocrinology.  He will continue Vyvanse 60 mg daily for focus, Vistaril 100 mg up to 3 times daily for anxiety, Paxil 40 mg daily for depression and trazodone 100 mg at bedtime for sleep.  He will return to see me in 3 months   [name], MD 09/18/2020, 2:00 PM

## 2020-09-19 ENCOUNTER — Encounter: Payer: Self-pay | Admitting: Family Medicine

## 2020-09-19 ENCOUNTER — Other Ambulatory Visit: Payer: Self-pay

## 2020-09-19 ENCOUNTER — Ambulatory Visit (INDEPENDENT_AMBULATORY_CARE_PROVIDER_SITE_OTHER): Payer: Medicaid Other | Admitting: Family Medicine

## 2020-09-19 VITALS — BP 122/73 | HR 94 | Ht 63.0 in | Wt 165.0 lb

## 2020-09-19 DIAGNOSIS — U099 Post covid-19 condition, unspecified: Secondary | ICD-10-CM

## 2020-09-19 DIAGNOSIS — R0789 Other chest pain: Secondary | ICD-10-CM | POA: Diagnosis not present

## 2020-09-19 DIAGNOSIS — R071 Chest pain on breathing: Secondary | ICD-10-CM | POA: Diagnosis not present

## 2020-09-19 NOTE — Progress Notes (Signed)
BP 122/73   Pulse 94   Ht _0  (1.6 m)   Wt 165 lb (74.8 kg)   SpO2 99%   BMI 29.23 kg/m    Subjective:   Patient ID: Ryan [name], male    DOB: 16-Jan-1996, 25 y.o.   MRN: [id number]  HPI: Ryan [name] is a 25 y.o. male presenting on 09/19/2020 for No chief complaint on file.   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  He still having some joint pain although that is better on the vitamin D.  He gets some hot flashes that are still there but not severe.  He has been having fatigue that has been a huge and sometimes debilitating.  He is also been having daily chest pressure and tightness but then a couple days ago he had an episode where it was really severe and lasted all day and he actually had to be put on his mom's sleep apnea machine to help him breathe that day, and has not been as bad since that day but that was quite significant for him which is what brought about him having this appointment.  He says the shortness of breath will happen on exertion or sometimes even just standing up.  He says is all been going off and on for the past year since being infected with COVID but the shortness of breath has been worsening.  Relevant past medical, surgical, family and social history reviewed and updated as indicated. Interim medical history since our last visit reviewed. Allergies and medications reviewed and updated.  Review of Systems  Constitutional: Positive for fatigue. Negative for chills and fever.  Eyes: Negative for visual disturbance.  Respiratory: Positive for chest tightness and shortness of breath. Negative for wheezing.   Cardiovascular: Positive for chest pain. Negative for leg swelling.  Gastrointestinal: Negative for abdominal pain.  Musculoskeletal: Positive for arthralgias. Negative for back pain and gait problem.  Skin: Negative for rash.  Neurological: Negative for dizziness, weakness and light-headedness.   All other systems reviewed and are negative.   Per HPI unless specifically indicated above   Allergies as of 09/19/2020      Reactions   Sulfa Antibiotics Rash      Medication List       Accurate as of Sep 19, 2020  9:25 AM. If you have any questions, ask your nurse or doctor.        Accu-Chek FastClix Lancet Kit Check BS QID Dx E11.9   Accu-Chek Guide test strip Generic drug: glucose blood Check BS QID Dx E11.9   Accu-Chek Softclix Lancets lancets Check BS QID Dx E11.9   blood glucose meter kit and supplies Dispense based on patient and insurance preference. Use up to four times daily as directed. (FOR ICD-10 E10.9, E11.9).   hydrOXYzine 100 MG capsule Commonly known as: VISTARIL Take 1 capsule (100 mg total) by mouth 3 (three) times daily as needed.   lisdexamfetamine 60 MG capsule Commonly known as: Vyvanse Take 1 capsule (60 mg total) by mouth every morning.   lisdexamfetamine 60 MG capsule Commonly known as: Vyvanse Take 1 capsule (60 mg total) by mouth every morning.   lisdexamfetamine 60 MG capsule Commonly known as: Vyvanse Take 1 capsule (60 mg total) by mouth every morning.   meloxicam 15 MG tablet Commonly known as: MOBIC Take 1 tablet (15 mg total) by mouth daily.   pantoprazole 40 MG tablet Commonly known as:  PROTONIX Take 1 tablet (40 mg total) by mouth 2 (two) times daily.   PARoxetine 40 MG tablet Commonly known as: PAXIL Take 1 tablet (40 mg total) by mouth daily.   traZODone 100 MG tablet Commonly known as: DESYREL Take 1 tablet (100 mg total) by mouth at bedtime.   Vitamin D3 125 MCG (5000 UT) Caps Take 1 capsule (5,000 Units total) by mouth daily.        Objective:   BP 122/73   Pulse 94   Ht _0  (1.6 m)   Wt 165 lb (74.8 kg)   SpO2 99%   BMI 29.23 kg/m   Wt Readings from Last 3 Encounters:  09/19/20 165 lb (74.8 kg)  01/02/20 171 lb (77.6 kg)  12/26/19 171 lb 4 oz (77.7 kg)    Physical Exam Vitals and nursing  note reviewed.  Constitutional:      General: He is not in acute distress.    Appearance: He is well-developed. He is not diaphoretic.  Eyes:     General: No scleral icterus.    Conjunctiva/sclera: Conjunctivae normal.  Neck:     Thyroid: No thyromegaly.  Cardiovascular:     Rate and Rhythm: Normal rate and regular rhythm.     Heart sounds: Normal heart sounds. No murmur heard.   Pulmonary:     Effort: Pulmonary effort is normal. No respiratory distress.     Breath sounds: Normal breath sounds. No wheezing.  Musculoskeletal:        General: Normal range of motion.     Cervical back: Neck supple.  Lymphadenopathy:     Cervical: No cervical adenopathy.  Skin:    General: Skin is warm and dry.     Findings: No rash.  Neurological:     Mental Status: He is alert and oriented to person, place, and time.     Coordination: Coordination normal.  Psychiatric:        Behavior: Behavior normal.       Assessment & Plan:   Problem List Items Addressed This Visit   None   Visit Diagnoses    Chest tightness    -  Primary   Relevant Orders   EKG 12-Lead   Post-COVID syndrome       Relevant Orders   EKG 12-Lead   Chest pain on breathing       Relevant Orders   CT ANGIO CHEST PE W OR WO CONTRAST   EKG 12-Lead      EKG: Normal sinus rhythm, heart rate 97  Will order CT for PE because chest tightness will give [place] good picture of the lungs and heart, post-COVID syndrome symptoms and still having issues, still trying to get specialist. Follow up plan: Return if symptoms worsen or fail to improve.  Counseling provided for all of the vaccine components No orders of the defined types were placed in this encounter.   Caryl Pina, MD [facility] 09/19/2020, 9:25 AM

## 2020-09-23 ENCOUNTER — Telehealth: Payer: Self-pay | Admitting: Family Medicine

## 2020-10-02 ENCOUNTER — Other Ambulatory Visit: Payer: Self-pay | Admitting: Family Medicine

## 2020-10-02 DIAGNOSIS — R5383 Other fatigue: Secondary | ICD-10-CM

## 2020-10-02 DIAGNOSIS — R0789 Other chest pain: Secondary | ICD-10-CM

## 2020-10-02 DIAGNOSIS — U099 Post covid-19 condition, unspecified: Secondary | ICD-10-CM

## 2020-10-02 DIAGNOSIS — M1990 Unspecified osteoarthritis, unspecified site: Secondary | ICD-10-CM

## 2020-10-02 NOTE — Progress Notes (Signed)
Placed referral for the patient. 

## 2020-10-16 ENCOUNTER — Telehealth: Payer: Self-pay | Admitting: Family Medicine

## 2020-10-16 NOTE — Telephone Encounter (Signed)
I do not know where the surgical referral came from, I have only placed immunology referrals and endocrinology referrals for the patient.  I do not even see a surgical referral on our referral page, I thought we were referring him somewhere to immunology at [facility] most recently.

## 2020-10-16 NOTE — Telephone Encounter (Signed)
Elysia called from [facility] in [facility] stating that they received referral for general surgery for pt but needs clarification on what the surgery is for because according to pt's diagnosis', they do not do surgery for those issues.  Please advise and call them or fax them.  Fax# [phone]

## 2020-11-26 ENCOUNTER — Telehealth (HOSPITAL_COMMUNITY): Payer: Self-pay | Admitting: Psychiatry

## 2020-11-26 NOTE — Telephone Encounter (Signed)
Do I still need to send in anything, it looks like he has refills in everything in the chart

## 2020-11-26 NOTE — Telephone Encounter (Signed)
This is Patient Ryan [name]

## 2020-11-26 NOTE — Telephone Encounter (Signed)
Spoke with pharmacy and they stated they have patient script for July and they are working on filling it for him. Staff spoke with Marchelle Folks.

## 2020-11-26 NOTE — Telephone Encounter (Signed)
Parent called in to get refill on all medications.

## 2020-11-27 NOTE — Telephone Encounter (Signed)
Not right now

## 2020-12-18 ENCOUNTER — Telehealth (INDEPENDENT_AMBULATORY_CARE_PROVIDER_SITE_OTHER): Payer: Self-pay | Admitting: Psychiatry

## 2020-12-18 ENCOUNTER — Other Ambulatory Visit: Payer: Self-pay

## 2020-12-18 DIAGNOSIS — Z5329 Procedure and treatment not carried out because of patient's decision for other reasons: Secondary | ICD-10-CM

## 2021-01-06 ENCOUNTER — Telehealth (INDEPENDENT_AMBULATORY_CARE_PROVIDER_SITE_OTHER): Payer: Medicaid Other | Admitting: Psychiatry

## 2021-01-06 ENCOUNTER — Encounter (HOSPITAL_COMMUNITY): Payer: Self-pay | Admitting: Psychiatry

## 2021-01-06 ENCOUNTER — Other Ambulatory Visit: Payer: Self-pay

## 2021-01-06 DIAGNOSIS — F900 Attention-deficit hyperactivity disorder, predominantly inattentive type: Secondary | ICD-10-CM

## 2021-01-06 DIAGNOSIS — F3162 Bipolar disorder, current episode mixed, moderate: Secondary | ICD-10-CM

## 2021-01-06 MED ORDER — BUPROPION HCL ER (XL) 150 MG PO TB24: 150.0000 mg | ORAL_TABLET | ORAL | 2 refills | Status: DC

## 2021-01-06 MED ORDER — HYDROXYZINE PAMOATE 100 MG PO CAPS: 100.0000 mg | ORAL_CAPSULE | Freq: Three times a day (TID) | ORAL | 2 refills | Status: DC | PRN

## 2021-01-06 MED ORDER — TRAZODONE HCL 100 MG PO TABS: 100.0000 mg | ORAL_TABLET | Freq: Every day | ORAL | 2 refills | Status: DC

## 2021-01-06 MED ORDER — LISDEXAMFETAMINE DIMESYLATE 60 MG PO CAPS: 60.0000 mg | ORAL_CAPSULE | ORAL | 0 refills | Status: DC

## 2021-01-06 NOTE — Progress Notes (Signed)
Opened in Error No Show 

## 2021-01-06 NOTE — Progress Notes (Signed)
Virtual Visit via Video Note  I connected with Ryan [name] on 01/06/21 at  2:00 PM EDT by a video enabled telemedicine application and verified that I am speaking with the correct person using two identifiers.  Location: Patient: home Provider: office   I discussed the limitations of evaluation and management by telemedicine and the availability of in person appointments. The patient expressed understanding and agreed to proceed.     I discussed the assessment and treatment plan with the patient. The patient was provided an opportunity to ask questions and all were answered. The patient agreed with the plan and demonstrated an understanding of the instructions.   The patient was advised to call back or seek an in-person evaluation if the symptoms worsen or if the condition fails to improve as anticipated.  I provided 15 minutes of non-face-to-face time during this encounter.   [name], MD  [facility] MD/PA/NP OP Progress Note  01/06/2021 2:15 PM Ryan [name]  MRN:  [id number]  Chief Complaint:  Chief Complaint   Anxiety; Follow-up; Depression; ADD    HPI: This patient is a 25 year old single white male who lives with his family in [place].  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  The patient returns for follow-up after 3 months.  Last time he states that he was suffering from long COVID symptoms such as muscle aches joint pain and fatigue.  He states that this is gotten progressively better.  He also states that he stopped the Paxil about 4 weeks ago due to fatigue and he is feeling less fatigued but slightly more depressed.  I suggested a switch to a more activating antidepressant such as bupropion.  The Vyvanse helps with his focus most of the time.  He has been playing video games in his spare time and I encouraged him to get outside more and get fresh air and exercise.  He is still quite invested in his work and generally his mood is good but we  do not want it to go backwards.  He denies significant anxiety. Visit Diagnosis:    ICD-10-CM   1. Attention deficit hyperactivity disorder (ADHD), predominantly inattentive type  F90.0     2. Bipolar 1 disorder, mixed, moderate (HCC)  F31.62       Past Psychiatric History: Long-term outpatient treatment  Past Medical History:  Past Medical History:  Diagnosis Date   Allergy    seasonal   Anxiety    Bipolar affective disorder ([place])    Depression    GERD (gastroesophageal reflux disease)    Seizures ([place])    seizures began in NOV 2013 ,last was 2014    Past Surgical History:  Procedure Laterality Date   TONSILLECTOMY      Family Psychiatric History: see below  Family History:  Family History  Problem Relation Age of Onset   ADD / ADHD Father    Mood Disorder Father    ADD / ADHD Sister    Mood Disorder Sister    Bipolar disorder Sister    ADD / ADHD Brother    Mood Disorder Brother    Bipolar disorder Brother    Depression Maternal Grandfather    Mood Disorder Paternal Grandfather    Mood Disorder Paternal Grandmother    ADD / ADHD Sister    Mood Disorder Sister    Bipolar disorder Sister    ADD / ADHD Brother    Mood Disorder Brother    Bipolar  disorder Brother     Social History:  Social History   Socioeconomic History   Marital status: Single    Spouse name: Not on file   Number of children: Not on file   Years of education: Not on file   Highest education level: Not on file  Occupational History   Not on file  Tobacco Use   Smoking status: Never   Smokeless tobacco: Never  Vaping Use   Vaping Use: Never used  Substance and Sexual Activity   Alcohol use: No    Alcohol/week: 0.0 standard drinks    Comment: 12-17-2015 per pt no    Drug use: No    Comment: 12-17-15 per pt no    Sexual activity: Never  Other Topics Concern   Not on file  Social History Narrative   Not on file   Social Determinants of Health   Financial Resource Strain: Not  on file  Food Insecurity: Not on file  Transportation Needs: Not on file  Physical Activity: Not on file  Stress: Not on file  Social Connections: Not on file    Allergies:  Allergies  Allergen Reactions   Sulfa Antibiotics Rash    Metabolic Disorder Labs: Lab Results  Component Value Date   HGBA1C 5.3 12/25/2018   No results found for: PROLACTIN No results found for: CHOL, TRIG, HDL, CHOLHDL, VLDL, LDLCALC Lab Results  Component Value Date   TSH 1.610 02/06/2019   TSH 1.490 12/25/2018    Therapeutic Level Labs: No results found for: LITHIUM Lab Results  Component Value Date   VALPROATE 106 (H) 05/18/2013   VALPROATE 64.9 03/10/2012   No components found for:  CBMZ  Current Medications: Current Outpatient Medications  Medication Sig Dispense Refill   buPROPion (WELLBUTRIN XL) 150 MG 24 hr tablet Take 1 tablet (150 mg total) by mouth every morning. 30 tablet 2   Accu-Chek Softclix Lancets lancets Check BS QID Dx E11.9 100 each 5   blood glucose meter kit and supplies Dispense based on patient and insurance preference. Use up to four times daily as directed. (FOR ICD-10 E10.9, E11.9). 1 each 0   Cholecalciferol (VITAMIN D3) 125 MCG (5000 UT) CAPS Take 1 capsule (5,000 Units total) by mouth daily. 100 capsule 0   glucose blood (ACCU-CHEK GUIDE) test strip Check BS QID Dx E11.9 100 each 5   hydrOXYzine (VISTARIL) 100 MG capsule Take 1 capsule (100 mg total) by mouth 3 (three) times daily as needed. 90 capsule 2   Lancets Misc. (ACCU-CHEK FASTCLIX LANCET) KIT Check BS QID Dx E11.9 1 kit 0   lisdexamfetamine (VYVANSE) 60 MG capsule Take 1 capsule (60 mg total) by mouth every morning. 30 capsule 0   lisdexamfetamine (VYVANSE) 60 MG capsule Take 1 capsule (60 mg total) by mouth every morning. 30 capsule 0   lisdexamfetamine (VYVANSE) 60 MG capsule Take 1 capsule (60 mg total) by mouth every morning. 30 capsule 0   meloxicam (MOBIC) 15 MG tablet Take 1 tablet (15 mg total) by  mouth daily. 90 tablet 3   pantoprazole (PROTONIX) 40 MG tablet Take 1 tablet (40 mg total) by mouth 2 (two) times daily. 60 tablet 11   traZODone (DESYREL) 100 MG tablet Take 1 tablet (100 mg total) by mouth at bedtime. 30 tablet 2   No current facility-administered medications for this visit.     Musculoskeletal: Strength & Muscle Tone: within normal limits Gait & Station: normal Patient leans: N/A  Psychiatric Specialty Exam: Review  of Systems  Psychiatric/Behavioral:  Positive for decreased concentration.   All other systems reviewed and are negative.  There were no vitals taken for this visit.There is no height or weight on file to calculate BMI.  General Appearance: Casual and Fairly Groomed  Eye Contact:  Good  Speech:  Clear and Coherent  Volume:  Normal  Mood:  Euthymic  Affect:  Appropriate and Congruent  Thought Process:  Goal Directed  Orientation:  Full (Time, Place, and Person)  Thought Content: WDL   Suicidal Thoughts:  No  Homicidal Thoughts:  No  Memory:  Immediate;   Good  Judgement:  Good  Insight:  Good  Psychomotor Activity:  Normal  Concentration:  Concentration: Fair and Attention Span: Fair  Recall:  Good  Fund of Knowledge: Good  Language: Good  Akathisia:  No  Handed:  Right  AIMS (if indicated): not done  Assets:  Communication Skills Desire for Improvement Physical Health Resilience Social Support Talents/Skills Vocational/Educational  ADL's:  Intact  Cognition: WNL  Sleep:  Good   Screenings: PHQ2-9    Flowsheet Row Video Visit from 01/06/2021 in [place] Video Visit from 09/18/2020 in [place] Office Visit from 12/25/2018 in [place] Office Visit from 04/08/2017 in [place] Visit from 11/10/2016 in [place]  PHQ-2 Total Score 1 0 0 0 0      Flowsheet Row Video  Visit from 01/06/2021 in [place] Video Visit from 09/18/2020 in [place] No Risk No Risk        Assessment and Plan: This patient is a 24 year old male with a history depression anxiety and ADHD.  Since he is still mildly depressed after stopping Paxil we will start Wellbutrin XL 150 mg every morning.  He will continue Vyvanse 60 mg daily for focus, Vistaril 100 mg up to 3 times daily for anxiety and trazodone 100 mg at bedtime for sleep.  He will return to see me in 2 months   [name], MD 01/06/2021, 2:15 PM

## 2021-01-17 ENCOUNTER — Other Ambulatory Visit: Payer: Self-pay | Admitting: Physician Assistant

## 2021-03-12 ENCOUNTER — Encounter (HOSPITAL_COMMUNITY): Payer: Self-pay | Admitting: Psychiatry

## 2021-03-12 ENCOUNTER — Other Ambulatory Visit: Payer: Self-pay

## 2021-03-12 ENCOUNTER — Telehealth (INDEPENDENT_AMBULATORY_CARE_PROVIDER_SITE_OTHER): Payer: Medicaid Other | Admitting: Psychiatry

## 2021-03-12 DIAGNOSIS — F3162 Bipolar disorder, current episode mixed, moderate: Secondary | ICD-10-CM | POA: Diagnosis not present

## 2021-03-12 DIAGNOSIS — F900 Attention-deficit hyperactivity disorder, predominantly inattentive type: Secondary | ICD-10-CM

## 2021-03-12 MED ORDER — LISDEXAMFETAMINE DIMESYLATE 70 MG PO CAPS: 70.0000 mg | ORAL_CAPSULE | Freq: Every day | ORAL | 0 refills | Status: DC

## 2021-03-12 MED ORDER — HYDROXYZINE PAMOATE 100 MG PO CAPS: 100.0000 mg | ORAL_CAPSULE | Freq: Three times a day (TID) | ORAL | 2 refills | Status: DC | PRN

## 2021-03-12 MED ORDER — TRAZODONE HCL 100 MG PO TABS: 100.0000 mg | ORAL_TABLET | Freq: Every day | ORAL | 2 refills | Status: DC

## 2021-03-12 MED ORDER — BUPROPION HCL ER (XL) 150 MG PO TB24
150.0000 mg | ORAL_TABLET | ORAL | 2 refills | Status: DC
Start: 2021-03-12 — End: 2021-07-09

## 2021-03-12 NOTE — Progress Notes (Signed)
Virtual Visit via Video Note  I connected with Ryan [name] on 03/12/21 at  1:00 PM EST by a video enabled telemedicine application and verified that I am speaking with the correct person using two identifiers.  Location: Patient: home Provider: office   I discussed the limitations of evaluation and management by telemedicine and the availability of in person appointments. The patient expressed understanding and agreed to proceed.     I discussed the assessment and treatment plan with the patient. The patient was provided an opportunity to ask questions and all were answered. The patient agreed with the plan and demonstrated an understanding of the instructions.   The patient was advised to call back or seek an in-person evaluation if the symptoms worsen or if the condition fails to improve as anticipated.  I provided 14 minutes of non-face-to-face time during this encounter.   [name], MD  [facility] MD/PA/NP OP Progress Note  03/12/2021 1:19 PM Ryan [name]  MRN:  [id number]  Chief Complaint:  Chief Complaint   Depression; Anxiety; ADD    HPI: This patient is a 25 year old single white male who lives with his family in [place].  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  The patient returns for follow-up after 3 months.  He states that his "long COVID symptoms" such as muscle aches joint pain and fatigue are progressively getting better.  He still is having some trouble with focus and motivation.  Last time we switched from Paxil to Wellbutrin and he thinks this was a good change and that he is less drowsy but still not depressed.  He does get mild exercise  going up the stairs at work but really no other organized exercise routine.  He spends a lot of time in front of the computer and I urged him to take breaks and to get more exercise.  Since he is still having some trouble with focus we will increase his Vyvanse.  He denies any thoughts of  self-harm or suicide and affect seems bright Visit Diagnosis:    ICD-10-CM   1. Attention deficit hyperactivity disorder (ADHD), predominantly inattentive type  F90.0     2. Bipolar 1 disorder, mixed, moderate (HCC)  F31.62       Past Psychiatric History: Long-term outpatient  Past Medical History:  Past Medical History:  Diagnosis Date   Allergy    seasonal   Anxiety    Bipolar affective disorder ([place])    Depression    GERD (gastroesophageal reflux disease)    Seizures ([place])    seizures began in NOV 2013 ,last was 2014    Past Surgical History:  Procedure Laterality Date   TONSILLECTOMY      Family Psychiatric History: see below  Family History:  Family History  Problem Relation Age of Onset   ADD / ADHD Father    Mood Disorder Father    ADD / ADHD Sister    Mood Disorder Sister    Bipolar disorder Sister    ADD / ADHD Brother    Mood Disorder Brother    Bipolar disorder Brother    Depression Maternal Grandfather    Mood Disorder Paternal Grandfather    Mood Disorder Paternal Grandmother    ADD / ADHD Sister    Mood Disorder Sister    Bipolar disorder Sister    ADD / ADHD Brother    Mood Disorder Brother    Bipolar disorder Brother  Social History:  Social History   Socioeconomic History   Marital status: Single    Spouse name: Not on file   Number of children: Not on file   Years of education: Not on file   Highest education level: Not on file  Occupational History   Not on file  Tobacco Use   Smoking status: Never   Smokeless tobacco: Never  Vaping Use   Vaping Use: Never used  Substance and Sexual Activity   Alcohol use: No    Alcohol/week: 0.0 standard drinks    Comment: 12-17-2015 per pt no    Drug use: No    Comment: 12-17-15 per pt no    Sexual activity: Never  Other Topics Concern   Not on file  Social History Narrative   Not on file   Social Determinants of Health   Financial Resource Strain: Not on file  Food Insecurity:  Not on file  Transportation Needs: Not on file  Physical Activity: Not on file  Stress: Not on file  Social Connections: Not on file    Allergies:  Allergies  Allergen Reactions   Sulfa Antibiotics Rash    Metabolic Disorder Labs: Lab Results  Component Value Date   HGBA1C 5.3 12/25/2018   No results found for: PROLACTIN No results found for: CHOL, TRIG, HDL, CHOLHDL, VLDL, LDLCALC Lab Results  Component Value Date   TSH 1.610 02/06/2019   TSH 1.490 12/25/2018    Therapeutic Level Labs: No results found for: LITHIUM Lab Results  Component Value Date   VALPROATE 106 (H) 05/18/2013   VALPROATE 64.9 03/10/2012   No components found for:  CBMZ  Current Medications: Current Outpatient Medications  Medication Sig Dispense Refill   lisdexamfetamine (VYVANSE) 70 MG capsule Take 1 capsule (70 mg total) by mouth daily. 30 capsule 0   lisdexamfetamine (VYVANSE) 70 MG capsule Take 1 capsule (70 mg total) by mouth daily. 30 capsule 0   lisdexamfetamine (VYVANSE) 70 MG capsule Take 1 capsule (70 mg total) by mouth daily. 30 capsule 0   Accu-Chek Softclix Lancets lancets Check BS QID Dx E11.9 100 each 5   blood glucose meter kit and supplies Dispense based on patient and insurance preference. Use up to four times daily as directed. (FOR ICD-10 E10.9, E11.9). 1 each 0   buPROPion (WELLBUTRIN XL) 150 MG 24 hr tablet Take 1 tablet (150 mg total) by mouth every morning. 30 tablet 2   Cholecalciferol (VITAMIN D3) 125 MCG (5000 UT) CAPS Take 1 capsule (5,000 Units total) by mouth daily. 100 capsule 0   glucose blood (ACCU-CHEK GUIDE) test strip Check BS QID Dx E11.9 100 each 5   hydrOXYzine (VISTARIL) 100 MG capsule Take 1 capsule (100 mg total) by mouth 3 (three) times daily as needed. 90 capsule 2   Lancets Misc. (ACCU-CHEK FASTCLIX LANCET) KIT Check BS QID Dx E11.9 1 kit 0   meloxicam (MOBIC) 15 MG tablet Take 1 tablet (15 mg total) by mouth daily. 90 tablet 3   pantoprazole (PROTONIX)  40 MG tablet TAKE ONE TABLET BY MOUTH TWICE A DAY 60 tablet 5   traZODone (DESYREL) 100 MG tablet Take 1 tablet (100 mg total) by mouth at bedtime. 30 tablet 2   No current facility-administered medications for this visit.     Musculoskeletal: Strength & Muscle Tone: within normal limits Gait & Station: normal Patient leans: N/A  Psychiatric Specialty Exam: Review of Systems  Psychiatric/Behavioral:  Positive for decreased concentration.   All other  systems reviewed and are negative.  There were no vitals taken for this visit.There is no height or weight on file to calculate BMI.  General Appearance: Casual, Neat, and Well Groomed  Eye Contact:  Good  Speech:  Clear and Coherent  Volume:  Normal  Mood:  Euthymic  Affect:  Appropriate and Congruent  Thought Process:  Goal Directed  Orientation:  Full (Time, Place, and Person)  Thought Content: WDL   Suicidal Thoughts:  No  Homicidal Thoughts:  No  Memory:  Immediate;   Good Recent;   Good Remote;   Good  Judgement:  Good  Insight:  Good  Psychomotor Activity:  Normal  Concentration:  Concentration: Fair and Attention Span: Fair  Recall:  Good  Fund of Knowledge: Good  Language: Good  Akathisia:  No  Handed:  Right  AIMS (if indicated): not done  Assets:  Communication Skills Desire for Improvement Physical Health Resilience Social Support Talents/Skills  ADL's:  Intact  Cognition: WNL  Sleep:  Good   Screenings: PHQ2-9    Flowsheet Row Video Visit from 03/12/2021 in [place] Video Visit from 01/06/2021 in [place] Video Visit from 09/18/2020 in [place] Office Visit from 12/25/2018 in [facility] Visit from 04/08/2017 in [place]  PHQ-2 Total Score 0 1 0 0 0      Flowsheet Row Video Visit from 01/06/2021 in [facility] Video Visit from 09/18/2020 in [place] [facility] No Risk No Risk        Assessment and Plan: This patient is a 25 year old male with a history of depression anxiety and ADHD.  His mood has improved with Wellbutrin XL 150 mg every morning and this will be continued.  Since he is still having some trouble with focus we will increase Vyvanse to 70 mg every morning.  He will continue Vistaril 100 mg up to 3 times daily as needed for anxiety and trazodone 100 mg at bedtime for sleep.  He will return to see me in 33-month  DLevonne Spiller MD 03/12/2021, 1:19 PM

## 2021-05-11 ENCOUNTER — Other Ambulatory Visit: Payer: Self-pay | Admitting: Family Medicine

## 2021-05-11 DIAGNOSIS — R5383 Other fatigue: Secondary | ICD-10-CM

## 2021-05-11 DIAGNOSIS — M2550 Pain in unspecified joint: Secondary | ICD-10-CM

## 2021-05-11 NOTE — Telephone Encounter (Signed)
Patient last seen 09/19/2020

## 2021-06-23 ENCOUNTER — Other Ambulatory Visit (HOSPITAL_COMMUNITY): Payer: Self-pay | Admitting: Psychiatry

## 2021-06-23 ENCOUNTER — Telehealth (HOSPITAL_COMMUNITY): Payer: Self-pay | Admitting: *Deleted

## 2021-06-23 MED ORDER — LISDEXAMFETAMINE DIMESYLATE 70 MG PO CAPS: 70.0000 mg | ORAL_CAPSULE | Freq: Every day | ORAL | 0 refills | Status: DC

## 2021-06-23 NOTE — Telephone Encounter (Signed)
Patient called to get refills for his medication for Vyvanse 70 mg

## 2021-06-23 NOTE — Telephone Encounter (Signed)
Sent, needs appt

## 2021-06-24 NOTE — Telephone Encounter (Signed)
Appt scheduled for 07-07-2021

## 2021-07-07 ENCOUNTER — Other Ambulatory Visit: Payer: Self-pay

## 2021-07-07 ENCOUNTER — Telehealth (HOSPITAL_COMMUNITY): Payer: Medicaid Other | Admitting: Psychiatry

## 2021-07-09 ENCOUNTER — Encounter (HOSPITAL_COMMUNITY): Payer: Self-pay | Admitting: Psychiatry

## 2021-07-09 ENCOUNTER — Telehealth (INDEPENDENT_AMBULATORY_CARE_PROVIDER_SITE_OTHER): Payer: Medicaid Other | Admitting: Psychiatry

## 2021-07-09 ENCOUNTER — Other Ambulatory Visit: Payer: Self-pay

## 2021-07-09 DIAGNOSIS — F900 Attention-deficit hyperactivity disorder, predominantly inattentive type: Secondary | ICD-10-CM | POA: Diagnosis not present

## 2021-07-09 DIAGNOSIS — F3162 Bipolar disorder, current episode mixed, moderate: Secondary | ICD-10-CM

## 2021-07-09 MED ORDER — BUPROPION HCL ER (XL) 300 MG PO TB24: 300.0000 mg | ORAL_TABLET | ORAL | 2 refills | Status: DC

## 2021-07-09 MED ORDER — HYDROXYZINE PAMOATE 100 MG PO CAPS: 100.0000 mg | ORAL_CAPSULE | Freq: Three times a day (TID) | ORAL | 2 refills | Status: DC | PRN

## 2021-07-09 MED ORDER — TRAZODONE HCL 100 MG PO TABS: 100.0000 mg | ORAL_TABLET | Freq: Every day | ORAL | 2 refills | Status: DC

## 2021-07-09 MED ORDER — LISDEXAMFETAMINE DIMESYLATE 70 MG PO CAPS
70.0000 mg | ORAL_CAPSULE | Freq: Every day | ORAL | 0 refills | Status: DC
Start: 2021-07-09 — End: 2021-09-15

## 2021-07-09 NOTE — Progress Notes (Signed)
Virtual Visit via Video Note  I connected with Ryan [name] on 07/09/21 at 11:40 AM EST by a video enabled telemedicine application and verified that I am speaking with the correct person using two identifiers.  Location: Patient: home Provider: office   I discussed the limitations of evaluation and management by telemedicine and the availability of in person appointments. The patient expressed understanding and agreed to proceed.     I discussed the assessment and treatment plan with the patient. The patient was provided an opportunity to ask questions and all were answered. The patient agreed with the plan and demonstrated an understanding of the instructions.   The patient was advised to call back or seek an in-person evaluation if the symptoms worsen or if the condition fails to improve as anticipated.  I provided 15 minutes of non-face-to-face time during this encounter.   [name], MD  [facility] MD/PA/NP OP Progress Note  07/09/2021 11:47 AM Ryan [name]  MRN:  [id number]  Chief Complaint:  Chief Complaint  Patient presents with   Depression   Anxiety   ADHD   Follow-up   HPI: This patient is a 26 year old single white male who lives with his family in [place].  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  The patient returns for follow-up after 4 months.  He states that his long COVID symptoms such as muscle ache joint pain and fatigue are getting better.  However he feels that his depression is getting worse.  He cannot think of any precipitating factors such as problems at work family relationships.  He is having more trouble staying focused.  Several days a week he feels really sad and a sense of impending doom.  He is getting a little bit more exercise and interactions with people.  He is sleeping and eating well.  He denies any thoughts of self-harm or suicide.  Since he is having more depressive symptoms we probably should increase his  Wellbutrin and he is in agreement with this. Visit Diagnosis:    ICD-10-CM   1. Attention deficit hyperactivity disorder (ADHD), predominantly inattentive type  F90.0     2. Bipolar 1 disorder, mixed, moderate (HCC)  F31.62       Past Psychiatric History: Long-term outpatient treatment  Past Medical History:  Past Medical History:  Diagnosis Date   Allergy    seasonal   Anxiety    Bipolar affective disorder ([place])    Depression    GERD (gastroesophageal reflux disease)    Seizures ([place])    seizures began in NOV 2013 ,last was 2014    Past Surgical History:  Procedure Laterality Date   TONSILLECTOMY      Family Psychiatric History: See below  Family History:  Family History  Problem Relation Age of Onset   ADD / ADHD Father    Mood Disorder Father    ADD / ADHD Sister    Mood Disorder Sister    Bipolar disorder Sister    ADD / ADHD Brother    Mood Disorder Brother    Bipolar disorder Brother    Depression Maternal Grandfather    Mood Disorder Paternal Grandfather    Mood Disorder Paternal Grandmother    ADD / ADHD Sister    Mood Disorder Sister    Bipolar disorder Sister    ADD / ADHD Brother    Mood Disorder Brother    Bipolar disorder Brother     Social History:  Social History   Socioeconomic History   Marital status: Single    Spouse name: Not on file   Number of children: Not on file   Years of education: Not on file   Highest education level: Not on file  Occupational History   Not on file  Tobacco Use   Smoking status: Never   Smokeless tobacco: Never  Vaping Use   Vaping Use: Never used  Substance and Sexual Activity   Alcohol use: No    Alcohol/week: 0.0 standard drinks    Comment: 12-17-2015 per pt no    Drug use: No    Comment: 12-17-15 per pt no    Sexual activity: Never  Other Topics Concern   Not on file  Social History Narrative   Not on file   Social Determinants of Health   Financial Resource Strain: Not on file  Food  Insecurity: Not on file  Transportation Needs: Not on file  Physical Activity: Not on file  Stress: Not on file  Social Connections: Not on file    Allergies:  Allergies  Allergen Reactions   Sulfa Antibiotics Rash    Metabolic Disorder Labs: Lab Results  Component Value Date   HGBA1C 5.3 12/25/2018   No results found for: PROLACTIN No results found for: CHOL, TRIG, HDL, CHOLHDL, VLDL, LDLCALC Lab Results  Component Value Date   TSH 1.610 02/06/2019   TSH 1.490 12/25/2018    Therapeutic Level Labs: No results found for: LITHIUM Lab Results  Component Value Date   VALPROATE 106 (H) 05/18/2013   VALPROATE 64.9 03/10/2012   No components found for:  CBMZ  Current Medications: Current Outpatient Medications  Medication Sig Dispense Refill   buPROPion (WELLBUTRIN XL) 300 MG 24 hr tablet Take 1 tablet (300 mg total) by mouth every morning. 30 tablet 2   Accu-Chek Softclix Lancets lancets Check BS QID Dx E11.9 100 each 5   blood glucose meter kit and supplies Dispense based on patient and insurance preference. Use up to four times daily as directed. (FOR ICD-10 E10.9, E11.9). 1 each 0   Cholecalciferol (VITAMIN D3) 125 MCG (5000 UT) CAPS Take 1 capsule (5,000 Units total) by mouth daily. 100 capsule 0   glucose blood (ACCU-CHEK GUIDE) test strip Check BS QID Dx E11.9 100 each 5   hydrOXYzine (VISTARIL) 100 MG capsule Take 1 capsule (100 mg total) by mouth 3 (three) times daily as needed. 90 capsule 2   Lancets Misc. (ACCU-CHEK FASTCLIX LANCET) KIT Check BS QID Dx E11.9 1 kit 0   lisdexamfetamine (VYVANSE) 70 MG capsule Take 1 capsule (70 mg total) by mouth daily. 30 capsule 0   lisdexamfetamine (VYVANSE) 70 MG capsule Take 1 capsule (70 mg total) by mouth daily. 30 capsule 0   lisdexamfetamine (VYVANSE) 70 MG capsule Take 1 capsule (70 mg total) by mouth daily. 30 capsule 0   meloxicam (MOBIC) 15 MG tablet TAKE ONE TABLET BY MOUTH DAILY 90 tablet 1   pantoprazole (PROTONIX)  40 MG tablet TAKE ONE TABLET BY MOUTH TWICE A DAY 60 tablet 5   traZODone (DESYREL) 100 MG tablet Take 1 tablet (100 mg total) by mouth at bedtime. 30 tablet 2   No current facility-administered medications for this visit.     Musculoskeletal: Strength & Muscle Tone: within normal limits Gait & Station: normal Patient leans: N/A  Psychiatric Specialty Exam: Review of Systems  Psychiatric/Behavioral:  Positive for decreased concentration and dysphoric mood.   All other systems reviewed and  are negative.  There were no vitals taken for this visit.There is no height or weight on file to calculate BMI.  General Appearance: Casual and Fairly Groomed  Eye Contact:  Good  Speech:  Clear and Coherent  Volume:  Normal  Mood:  Dysphoric  Affect:  Appropriate and Congruent  Thought Process:  Goal Directed  Orientation:  Full (Time, Place, and Person)  Thought Content: Rumination   Suicidal Thoughts:  No  Homicidal Thoughts:  No  Memory:  Immediate;   Good Recent;   Good Remote;   Good  Judgement:  Good  Insight:  Good  Psychomotor Activity:  Normal  Concentration:  Concentration: Fair and Attention Span: Fair  Recall:  Good  Fund of Knowledge: Good  Language: Good  Akathisia:  No  Handed:  Right  AIMS (if indicated): not done  Assets:  Communication Skills Desire for Improvement Physical Health Resilience Social Support Talents/Skills  ADL's:  Intact  Cognition: WNL  Sleep:  Good   Screenings: PHQ2-9    Flowsheet Row Video Visit from 07/09/2021 in [place] Video Visit from 03/12/2021 in [place] Video Visit from 01/06/2021 in [place] Video Visit from 09/18/2020 in [facility] Office Visit from 12/25/2018 in [place]  PHQ-2 Total Score 4 0 1 0 0  PHQ-9 Total Score 8 -- -- --  --      Flowsheet Row Video Visit from 07/09/2021 in [place] Video Visit from 01/06/2021 in [facility] Video Visit from 09/18/2020 in [place] [facility] No Risk No Risk No Risk        Assessment and Plan: This patient is a 26 year old male with a history of bipolar disorder and ADHD.  His mood initially improved with Wellbutrin XL 150 mg daily but this dosage now may not be enough so we will go up to 300 mg.  For now he will continue Vyvanse 70 mg every morning for ADD, Vistaril 100 mg 3 times daily as needed for anxiety and trazodone 100 mg at bedtime for sleep.  He will return to see me in 4 weeks or call sooner if symptoms worsen  Collaboration of Care: Collaboration of Care: Primary Care Provider AEB chart notes will be made available to PCP at patient's request  Patient/Guardian was advised Release of Information must be obtained prior to any record release in order to collaborate their care with an outside provider. Patient/Guardian was advised if they have not already done so to contact the registration department to sign all necessary forms in order for [place] to release information regarding their care.   Consent: Patient/Guardian gives verbal consent for treatment and assignment of benefits for services provided during this visit. Patient/Guardian expressed understanding and agreed to proceed.    [name], MD 07/09/2021, 11:47 AM

## 2021-09-06 ENCOUNTER — Other Ambulatory Visit: Payer: Self-pay | Admitting: Physician Assistant

## 2021-09-11 ENCOUNTER — Other Ambulatory Visit: Payer: Self-pay | Admitting: Physician Assistant

## 2021-09-15 ENCOUNTER — Other Ambulatory Visit (HOSPITAL_COMMUNITY): Payer: Self-pay | Admitting: Psychiatry

## 2021-09-15 ENCOUNTER — Telehealth (HOSPITAL_COMMUNITY): Payer: Self-pay | Admitting: *Deleted

## 2021-09-15 MED ORDER — LISDEXAMFETAMINE DIMESYLATE 70 MG PO CAPS: 70.0000 mg | ORAL_CAPSULE | Freq: Every day | ORAL | 0 refills | Status: DC

## 2021-09-15 NOTE — Telephone Encounter (Signed)
send

## 2021-09-15 NOTE — Telephone Encounter (Signed)
Patient called to requesting refills for his Vyvanse.  ?

## 2021-09-23 ENCOUNTER — Encounter (HOSPITAL_COMMUNITY): Payer: Self-pay | Admitting: Psychiatry

## 2021-09-23 ENCOUNTER — Telehealth (INDEPENDENT_AMBULATORY_CARE_PROVIDER_SITE_OTHER): Payer: Medicaid Other | Admitting: Psychiatry

## 2021-09-23 DIAGNOSIS — F900 Attention-deficit hyperactivity disorder, predominantly inattentive type: Secondary | ICD-10-CM

## 2021-09-23 DIAGNOSIS — F3162 Bipolar disorder, current episode mixed, moderate: Secondary | ICD-10-CM | POA: Diagnosis not present

## 2021-09-23 MED ORDER — LISDEXAMFETAMINE DIMESYLATE 70 MG PO CAPS
70.0000 mg | ORAL_CAPSULE | Freq: Every day | ORAL | 0 refills | Status: DC
Start: 2021-09-23 — End: 2021-12-21

## 2021-09-23 MED ORDER — BUPROPION HCL ER (XL) 300 MG PO TB24: 300.0000 mg | ORAL_TABLET | ORAL | 2 refills | Status: DC

## 2021-09-23 MED ORDER — LISDEXAMFETAMINE DIMESYLATE 70 MG PO CAPS: 70.0000 mg | ORAL_CAPSULE | Freq: Every day | ORAL | 0 refills | Status: DC

## 2021-09-23 MED ORDER — TRAZODONE HCL 100 MG PO TABS: 100.0000 mg | ORAL_TABLET | Freq: Every day | ORAL | 2 refills | Status: DC

## 2021-09-23 MED ORDER — HYDROXYZINE PAMOATE 100 MG PO CAPS: 100.0000 mg | ORAL_CAPSULE | Freq: Three times a day (TID) | ORAL | 2 refills | Status: DC | PRN

## 2021-09-23 NOTE — Progress Notes (Signed)
Virtual Visit via Video Note  I connected with Ryan [name] on 09/23/21 at  9:20 AM EDT by a video enabled telemedicine application and verified that I am speaking with the correct person using two identifiers.  Location: Patient: home Provider: office   I discussed the limitations of evaluation and management by telemedicine and the availability of in person appointments. The patient expressed understanding and agreed to proceed.      I discussed the assessment and treatment plan with the patient. The patient was provided an opportunity to ask questions and all were answered. The patient agreed with the plan and demonstrated an understanding of the instructions.   The patient was advised to call back or seek an in-person evaluation if the symptoms worsen or if the condition fails to improve as anticipated.  I provided 20 minutes of non-face-to-face time during this encounter.   [name], MD  [employer] [facility] [employer] MD/PA/NP OP Progress Note  09/23/2021 9:38 AM Ryan [name]  MRN:  [id number]  Chief Complaint:  Chief Complaint  Patient presents with   Anxiety   Depression   ADHD   Follow-up   HPI: This patient is a 26 year old married white male who lives with his wife in [place].  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  The patient returns for follow-up after about 3 months.  He states that he got married about 3 weeks ago.  He and his wife have gotten their own place which is only a few blocks away from his family.  He is still working at the same job and enjoying it.  He states that his mood has been very good and the increase in Wellbutrin has helped as well as the marriage.  He is staying focused and alert at work.  He is sleeping well at night.  He denies significant anxiety.  He denies any thoughts of self-harm or suicide.  He feels like his current medications are working well for him right now. Visit Diagnosis:    ICD-10-CM   1. Attention deficit  hyperactivity disorder (ADHD), predominantly inattentive type  F90.0     2. Bipolar 1 disorder, mixed, moderate (HCC)  F31.62       Past Psychiatric History: Long-term outpatient treatment  Past Medical History:  Past Medical History:  Diagnosis Date   Allergy    seasonal   Anxiety    Bipolar affective disorder ([place])    Depression    GERD (gastroesophageal reflux disease)    Seizures ([place])    seizures began in NOV 2013 ,last was 2014    Past Surgical History:  Procedure Laterality Date   TONSILLECTOMY      Family Psychiatric History: See below  Family History:  Family History  Problem Relation Age of Onset   ADD / ADHD Father    Mood Disorder Father    ADD / ADHD Sister    Mood Disorder Sister    Bipolar disorder Sister    ADD / ADHD Brother    Mood Disorder Brother    Bipolar disorder Brother    Depression Maternal Grandfather    Mood Disorder Paternal Grandfather    Mood Disorder Paternal Grandmother    ADD / ADHD Sister    Mood Disorder Sister    Bipolar disorder Sister    ADD / ADHD Brother    Mood Disorder Brother    Bipolar disorder Brother     Social History:  Social History   Socioeconomic  History   Marital status: Single    Spouse name: Not on file   Number of children: Not on file   Years of education: Not on file   Highest education level: Not on file  Occupational History   Not on file  Tobacco Use   Smoking status: Never   Smokeless tobacco: Never  Vaping Use   Vaping Use: Never used  Substance and Sexual Activity   Alcohol use: No    Alcohol/week: 0.0 standard drinks    Comment: 12-17-2015 per pt no    Drug use: No    Comment: 12-17-15 per pt no    Sexual activity: Never  Other Topics Concern   Not on file  Social History Narrative   Not on file   Social Determinants of Health   Financial Resource Strain: Not on file  Food Insecurity: Not on file  Transportation Needs: Not on file  Physical Activity: Not on file  Stress:  Not on file  Social Connections: Not on file    Allergies:  Allergies  Allergen Reactions   Sulfa Antibiotics Rash    Metabolic Disorder Labs: Lab Results  Component Value Date   HGBA1C 5.3 12/25/2018   No results found for: PROLACTIN No results found for: CHOL, TRIG, HDL, CHOLHDL, VLDL, LDLCALC Lab Results  Component Value Date   TSH 1.610 02/06/2019   TSH 1.490 12/25/2018    Therapeutic Level Labs: No results found for: LITHIUM Lab Results  Component Value Date   VALPROATE 106 (H) 05/18/2013   VALPROATE 64.9 03/10/2012   No components found for:  CBMZ  Current Medications: Current Outpatient Medications  Medication Sig Dispense Refill   Accu-Chek Softclix Lancets lancets Check BS QID Dx E11.9 100 each 5   blood glucose meter kit and supplies Dispense based on patient and insurance preference. Use up to four times daily as directed. (FOR ICD-10 E10.9, E11.9). 1 each 0   buPROPion (WELLBUTRIN XL) 300 MG 24 hr tablet Take 1 tablet (300 mg total) by mouth every morning. 30 tablet 2   Cholecalciferol (VITAMIN D3) 125 MCG (5000 UT) CAPS Take 1 capsule (5,000 Units total) by mouth daily. 100 capsule 0   glucose blood (ACCU-CHEK GUIDE) test strip Check BS QID Dx E11.9 100 each 5   hydrOXYzine (VISTARIL) 100 MG capsule Take 1 capsule (100 mg total) by mouth 3 (three) times daily as needed. 90 capsule 2   Lancets Misc. (ACCU-CHEK FASTCLIX LANCET) KIT Check BS QID Dx E11.9 1 kit 0   lisdexamfetamine (VYVANSE) 70 MG capsule Take 1 capsule (70 mg total) by mouth daily. 30 capsule 0   lisdexamfetamine (VYVANSE) 70 MG capsule Take 1 capsule (70 mg total) by mouth daily. 30 capsule 0   lisdexamfetamine (VYVANSE) 70 MG capsule Take 1 capsule (70 mg total) by mouth daily. 30 capsule 0   meloxicam (MOBIC) 15 MG tablet TAKE ONE TABLET BY MOUTH DAILY 90 tablet 1   pantoprazole (PROTONIX) 40 MG tablet TAKE ONE TABLET BY MOUTH TWICE A DAY 60 tablet 5   traZODone (DESYREL) 100 MG tablet Take  1 tablet (100 mg total) by mouth at bedtime. 30 tablet 2   No current facility-administered medications for this visit.     Musculoskeletal: Strength & Muscle Tone: within normal limits Gait & Station: normal Patient leans: N/A  Psychiatric Specialty Exam: Review of Systems  All other systems reviewed and are negative.  There were no vitals taken for this visit.There is no height or weight  on file to calculate BMI.  General Appearance: Casual, Neat, and Well Groomed  Eye Contact:  Good  Speech:  Clear and Coherent  Volume:  Normal  Mood:  Euthymic  Affect:  Appropriate and Congruent  Thought Process:  Goal Directed  Orientation:  Full (Time, Place, and Person)  Thought Content: WDL   Suicidal Thoughts:  No  Homicidal Thoughts:  No  Memory:  Immediate;   Good Recent;   Good Remote;   Good  Judgement:  Good  Insight:  Good  Psychomotor Activity:  Normal  Concentration:  Concentration: Good and Attention Span: Good  Recall:  Good  Fund of Knowledge: Good  Language: Good  Akathisia:  No  Handed:  Right  AIMS (if indicated): not done  Assets:  Communication Skills Desire for Improvement Physical Health Resilience Social Support Talents/Skills  ADL's:  Intact  Cognition: WNL  Sleep:  Good   Screenings: PHQ2-9    Flowsheet Row Video Visit from 09/23/2021 in [facility] Video Visit from 07/09/2021 in [facility] Video Visit from 03/12/2021 in [facility] [facility] Video Visit from 01/06/2021 in [facility] [facility] Video Visit from 09/18/2020 in [facility]  PHQ-2 Total Score 0 4 0 1 0  PHQ-9 Total Score -- 8 -- -- --      Flowsheet Row Video Visit from 09/23/2021 in [facility] Video Visit from 07/09/2021 in [facility] Video Visit from 01/06/2021 in [facility] No Risk No Risk No Risk        Assessment and Plan: This patient is a 26 year old male with a history of bipolar disorder and ADHD.  He is doing very well and on his current regimen.  He will continue Wellbutrin XL 300 mg daily for depression, Vyvanse 70 mg every morning for ADD, Vistaril 100 mg 3 times daily as needed for anxiety and trazodone 100 mg at bedtime for sleep.  He will return to see me in 3 months.  Collaboration of Care: Collaboration of Care: Primary Care Provider AEB chart notes will be shared with PCP at patient's request  Patient/Guardian was advised Release of Information must be obtained prior to any record release in order to collaborate their care with an outside provider. Patient/Guardian was advised if they have not already done so to contact the registration department to sign all necessary forms in order for [place] to release information regarding their care.   Consent: Patient/Guardian gives verbal consent for treatment and assignment of benefits for services provided during this visit. Patient/Guardian expressed understanding and agreed to proceed.    [name], MD 09/23/2021, 9:38 AM

## 2021-12-21 ENCOUNTER — Encounter (HOSPITAL_COMMUNITY): Payer: Self-pay | Admitting: Psychiatry

## 2021-12-21 ENCOUNTER — Telehealth (INDEPENDENT_AMBULATORY_CARE_PROVIDER_SITE_OTHER): Payer: Medicaid Other | Admitting: Psychiatry

## 2021-12-21 DIAGNOSIS — F900 Attention-deficit hyperactivity disorder, predominantly inattentive type: Secondary | ICD-10-CM

## 2021-12-21 DIAGNOSIS — F3162 Bipolar disorder, current episode mixed, moderate: Secondary | ICD-10-CM | POA: Diagnosis not present

## 2021-12-21 MED ORDER — LISDEXAMFETAMINE DIMESYLATE 70 MG PO CAPS: 70.0000 mg | ORAL_CAPSULE | Freq: Every day | ORAL | 0 refills | Status: DC

## 2021-12-21 MED ORDER — TRAZODONE HCL 100 MG PO TABS: 100.0000 mg | ORAL_TABLET | Freq: Every day | ORAL | 2 refills | Status: DC

## 2021-12-21 MED ORDER — TRAZODONE HCL 100 MG PO TABS
100.0000 mg | ORAL_TABLET | Freq: Every day | ORAL | 2 refills | Status: DC
Start: 2021-12-21 — End: 2022-03-24

## 2021-12-21 MED ORDER — BUPROPION HCL ER (XL) 300 MG PO TB24
300.0000 mg | ORAL_TABLET | ORAL | 2 refills | Status: DC
Start: 2021-12-21 — End: 2022-03-24

## 2021-12-21 MED ORDER — HYDROXYZINE PAMOATE 100 MG PO CAPS: 100.0000 mg | ORAL_CAPSULE | Freq: Three times a day (TID) | ORAL | 2 refills | Status: DC | PRN

## 2021-12-21 MED ORDER — BUPROPION HCL ER (XL) 300 MG PO TB24: 300.0000 mg | ORAL_TABLET | ORAL | 2 refills | Status: DC

## 2021-12-21 NOTE — Progress Notes (Signed)
Virtual Visit via Video Note  I connected with Ryan [name] on 12/21/21 at 10:20 AM EDT by a video enabled telemedicine application and verified that I am speaking with the correct person using two identifiers.  Location: Patient: home Provider: home office   I discussed the limitations of evaluation and management by telemedicine and the availability of in person appointments. The patient expressed understanding and agreed to proceed.     I discussed the assessment and treatment plan with the patient. The patient was provided an opportunity to ask questions and all were answered. The patient agreed with the plan and demonstrated an understanding of the instructions.   The patient was advised to call back or seek an in-person evaluation if the symptoms worsen or if the condition fails to improve as anticipated.  I provided 20 minutes of non-face-to-face time during this encounter.   [name], MD  [facility] MD/PA/NP OP Progress Note  12/21/2021 10:38 AM Ryan [name]  MRN:  [id number]  Chief Complaint:  Chief Complaint  Patient presents with   Depression   Anxiety   ADHD   Follow-up   HPI: This patient is a 26 year old married white male who lives with his wife in [place].  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  The patient returns for follow-up after 3 months.  He states he has not been able to get the Vyvanse for about a week because of the national shortage.  His pharmacy does not stock it right now.  He has had a hard time paying attention staying on task and feels tired and unmotivated.  I will try sending it to another pharmacy but if it does not work out we may have to switch back to Adderall XR.  In general his mood has been good.  He is sleeping well.  He denies any thoughts of self-harm or suicide or significant anxiety. Visit Diagnosis:    ICD-10-CM   1. Attention deficit hyperactivity disorder (ADHD), predominantly inattentive type   F90.0     2. Bipolar 1 disorder, mixed, moderate (HCC)  F31.62       Past Psychiatric History: Long-term outpatient treatment  Past Medical History:  Past Medical History:  Diagnosis Date   Allergy    seasonal   Anxiety    Bipolar affective disorder ([place])    Depression    GERD (gastroesophageal reflux disease)    Seizures ([place])    seizures began in NOV 2013 ,last was 2014    Past Surgical History:  Procedure Laterality Date   TONSILLECTOMY      Family Psychiatric History: See below  Family History:  Family History  Problem Relation Age of Onset   ADD / ADHD Father    Mood Disorder Father    ADD / ADHD Sister    Mood Disorder Sister    Bipolar disorder Sister    ADD / ADHD Brother    Mood Disorder Brother    Bipolar disorder Brother    Depression Maternal Grandfather    Mood Disorder Paternal Grandfather    Mood Disorder Paternal Grandmother    ADD / ADHD Sister    Mood Disorder Sister    Bipolar disorder Sister    ADD / ADHD Brother    Mood Disorder Brother    Bipolar disorder Brother     Social History:  Social History   Socioeconomic History   Marital status: Single    Spouse name: Not on file  Number of children: Not on file   Years of education: Not on file   Highest education level: Not on file  Occupational History   Not on file  Tobacco Use   Smoking status: Never   Smokeless tobacco: Never  Vaping Use   Vaping Use: Never used  Substance and Sexual Activity   Alcohol use: No    Alcohol/week: 0.0 standard drinks of alcohol    Comment: 12-17-2015 per pt no    Drug use: No    Comment: 12-17-15 per pt no    Sexual activity: Never  Other Topics Concern   Not on file  Social History Narrative   Not on file   Social Determinants of Health   Financial Resource Strain: Not on file  Food Insecurity: Not on file  Transportation Needs: Not on file  Physical Activity: Not on file  Stress: Not on file  Social Connections: Not on file     Allergies:  Allergies  Allergen Reactions   Sulfa Antibiotics Rash    Metabolic Disorder Labs: Lab Results  Component Value Date   HGBA1C 5.3 12/25/2018   No results found for: "PROLACTIN" No results found for: "CHOL", "TRIG", "HDL", "CHOLHDL", "VLDL", "[place]" Lab Results  Component Value Date   TSH 1.610 02/06/2019   TSH 1.490 12/25/2018    Therapeutic Level Labs: No results found for: "LITHIUM" Lab Results  Component Value Date   VALPROATE 106 (H) 05/18/2013   VALPROATE 64.9 03/10/2012   No results found for: "CBMZ"  Current Medications: Current Outpatient Medications  Medication Sig Dispense Refill   Accu-Chek Softclix Lancets lancets Check BS QID Dx E11.9 100 each 5   blood glucose meter kit and supplies Dispense based on patient and insurance preference. Use up to four times daily as directed. (FOR ICD-10 E10.9, E11.9). 1 each 0   buPROPion (WELLBUTRIN XL) 300 MG 24 hr tablet Take 1 tablet (300 mg total) by mouth every morning. 30 tablet 2   Cholecalciferol (VITAMIN D3) 125 MCG (5000 UT) CAPS Take 1 capsule (5,000 Units total) by mouth daily. 100 capsule 0   glucose blood (ACCU-CHEK GUIDE) test strip Check BS QID Dx E11.9 100 each 5   hydrOXYzine (VISTARIL) 100 MG capsule Take 1 capsule (100 mg total) by mouth 3 (three) times daily as needed. 90 capsule 2   Lancets Misc. (ACCU-CHEK FASTCLIX LANCET) KIT Check BS QID Dx E11.9 1 kit 0   lisdexamfetamine (VYVANSE) 70 MG capsule Take 1 capsule (70 mg total) by mouth daily. 30 capsule 0   lisdexamfetamine (VYVANSE) 70 MG capsule Take 1 capsule (70 mg total) by mouth daily. 30 capsule 0   lisdexamfetamine (VYVANSE) 70 MG capsule Take 1 capsule (70 mg total) by mouth daily. 30 capsule 0   meloxicam (MOBIC) 15 MG tablet TAKE ONE TABLET BY MOUTH DAILY 90 tablet 1   pantoprazole (PROTONIX) 40 MG tablet TAKE ONE TABLET BY MOUTH TWICE A DAY 60 tablet 5   traZODone (DESYREL) 100 MG tablet Take 1 tablet (100 mg total) by  mouth at bedtime. 30 tablet 2   No current facility-administered medications for this visit.     Musculoskeletal: Strength & Muscle Tone: within normal limits Gait & Station: normal Patient leans: N/A  Psychiatric Specialty Exam: Review of Systems  Constitutional:  Positive for fatigue.  Psychiatric/Behavioral:  Positive for decreased concentration.   All other systems reviewed and are negative.   There were no vitals taken for this visit.There is no height or weight  on file to calculate BMI.  General Appearance: Casual and Fairly Groomed  Eye Contact:  Good  Speech:  Clear and Coherent  Volume:  Normal  Mood:  Euthymic  Affect:  Flat  Thought Process:  Goal Directed  Orientation:  Full (Time, Place, and Person)  Thought Content: WDL   Suicidal Thoughts:  No  Homicidal Thoughts:  No  Memory:  Immediate;   Good Recent;   Good Remote;   Good  Judgement:  Good  Insight:  Good  Psychomotor Activity:  Normal  Concentration:  Concentration: Poor and Attention Span: Poor  Recall:  Good  Fund of Knowledge: Good  Language: Good  Akathisia:  No  Handed:  Right  AIMS (if indicated): not done  Assets:  Communication Skills Desire for Improvement Physical Health Resilience Social Support Talents/Skills  ADL's:  Intact  Cognition: WNL  Sleep:  Good   Screenings: PHQ2-9    Flowsheet Row Video Visit from 12/21/2021 in [facility] Video Visit from 09/23/2021 in [facility] Video Visit from 07/09/2021 in [facility] Video Visit from 03/12/2021 in [facility] Video Visit from 01/06/2021 in [facility]  PHQ-2 Total Score 2 0 4 0 1  PHQ-9 Total Score 4 -- 8 -- --      Flowsheet Row Video Visit from 12/21/2021 in [facility] Video  Visit from 09/23/2021 in [facility] Video Visit from 07/09/2021 in [facility] No Risk No Risk No Risk        Assessment and Plan: This patient is a 26 year old male with a history of bipolar disorder and ADHD.  He is not doing well without the Vyvanse but I will try to send the 70 mg dosage to another pharmacy.  He will continue Wellbutrin XL 300 mg daily for depression Vistaril 100 mg 3 times daily as needed for anxiety and trazodone 100 mg at bedtime for sleep.  He will return to see me in 3 months but call in the next day or 2 if he cannot get the Vyvanse.  Collaboration of Care: Collaboration of Care: Primary Care Provider AEB notes will be shared with PCP at patient request  Patient/Guardian was advised Release of Information must be obtained prior to any record release in order to collaborate their care with an outside provider. Patient/Guardian was advised if they have not already done so to contact the registration department to sign all necessary forms in order for [place] to release information regarding their care.   Consent: Patient/Guardian gives verbal consent for treatment and assignment of benefits for services provided during this visit. Patient/Guardian expressed understanding and agreed to proceed.    [name], MD 12/21/2021, 10:38 AM

## 2021-12-22 ENCOUNTER — Telehealth (HOSPITAL_COMMUNITY): Payer: Self-pay | Admitting: *Deleted

## 2021-12-22 NOTE — Telephone Encounter (Signed)
Patient mother called stating that they called [facility] pharmacy and they do not have the Vyvanse 70mg  in stock but they have the Vyvanse 50mg  and 20mg  in stock. Per pt mother, she would like for provider to please send in Vyvanse 50mg  and 20mg  to total the full amount patient is suppose to regularly take. Patient is aware provider is out of the office and is fine with provider filling medication the next day she comes in.

## 2021-12-23 ENCOUNTER — Other Ambulatory Visit (HOSPITAL_COMMUNITY): Payer: Self-pay | Admitting: Psychiatry

## 2021-12-23 MED ORDER — LISDEXAMFETAMINE DIMESYLATE 50 MG PO CAPS: 50.0000 mg | ORAL_CAPSULE | Freq: Every day | ORAL | 0 refills | Status: DC

## 2021-12-23 MED ORDER — LISDEXAMFETAMINE DIMESYLATE 20 MG PO CAPS: 20.0000 mg | ORAL_CAPSULE | Freq: Every day | ORAL | 0 refills | Status: DC

## 2021-12-23 NOTE — Telephone Encounter (Signed)
See below

## 2022-03-24 ENCOUNTER — Encounter (HOSPITAL_COMMUNITY): Payer: Self-pay | Admitting: Psychiatry

## 2022-03-24 ENCOUNTER — Telehealth (INDEPENDENT_AMBULATORY_CARE_PROVIDER_SITE_OTHER): Payer: Medicaid Other | Admitting: Psychiatry

## 2022-03-24 DIAGNOSIS — F3162 Bipolar disorder, current episode mixed, moderate: Secondary | ICD-10-CM

## 2022-03-24 DIAGNOSIS — F900 Attention-deficit hyperactivity disorder, predominantly inattentive type: Secondary | ICD-10-CM | POA: Diagnosis not present

## 2022-03-24 MED ORDER — LISDEXAMFETAMINE DIMESYLATE 70 MG PO CAPS
70.0000 mg | ORAL_CAPSULE | Freq: Every day | ORAL | 0 refills | Status: DC
Start: 2022-03-24 — End: 2022-07-15

## 2022-03-24 MED ORDER — LISDEXAMFETAMINE DIMESYLATE 70 MG PO CAPS: 70.0000 mg | ORAL_CAPSULE | Freq: Every day | ORAL | 0 refills | Status: DC

## 2022-03-24 MED ORDER — TRAZODONE HCL 100 MG PO TABS
100.0000 mg | ORAL_TABLET | Freq: Every day | ORAL | 2 refills | Status: DC
Start: 2022-03-24 — End: 2022-07-15

## 2022-03-24 MED ORDER — BUPROPION HCL ER (XL) 300 MG PO TB24: 300.0000 mg | ORAL_TABLET | ORAL | 2 refills | Status: DC

## 2022-03-24 MED ORDER — HYDROXYZINE PAMOATE 100 MG PO CAPS
100.0000 mg | ORAL_CAPSULE | Freq: Three times a day (TID) | ORAL | 2 refills | Status: DC | PRN
Start: 2022-03-24 — End: 2022-07-15

## 2022-03-24 NOTE — Progress Notes (Signed)
Virtual Visit via Telephone Note  I connected with Ryan [name] on 03/24/22 at  1:00 PM EST by telephone and verified that I am speaking with the correct person using two identifiers.  Location: Patient: home Provider: office   I discussed the limitations, risks, security and privacy concerns of performing an evaluation and management service by telephone and the availability of in person appointments. I also discussed with the patient that there may be a patient responsible charge related to this service. The patient expressed understanding and agreed to proceed.     I discussed the assessment and treatment plan with the patient. The patient was provided an opportunity to ask questions and all were answered. The patient agreed with the plan and demonstrated an understanding of the instructions.   The patient was advised to call back or seek an in-person evaluation if the symptoms worsen or if the condition fails to improve as anticipated.  I provided 13 minutes of non-face-to-face time during this encounter.   [name], MD  [facility] MD/PA/NP OP Progress Note  03/24/2022 1:10 PM [name] Ryan  MRN:  [id number]  Chief Complaint:  Chief Complaint  Patient presents with   ADHD   Depression   Anxiety   Follow-up   HPI: This patient is a 26 year old married white male who lives with his wife in [place].  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  The patient returns for follow-up after 3 months.  He states he continues to do very well.  He is really enjoying his job and finds it interesting and challenging.  His mood has been good and he denies significant depression anxiety thoughts of self-harm.  He is sleeping well with the trazodone.  He is focusing well with the Vyvanse. Visit Diagnosis:    ICD-10-CM   1. Attention deficit hyperactivity disorder (ADHD), predominantly inattentive type  F90.0     2. Bipolar 1 disorder, mixed, moderate (HCC)  F31.62        Past Psychiatric History: Long-term outpatient treatment  Past Medical History:  Past Medical History:  Diagnosis Date   Allergy    seasonal   Anxiety    Bipolar affective disorder ([place])    Depression    GERD (gastroesophageal reflux disease)    Seizures ([place])    seizures began in NOV 2013 ,last was 2014    Past Surgical History:  Procedure Laterality Date   TONSILLECTOMY      Family Psychiatric History: see below  Family History:  Family History  Problem Relation Age of Onset   ADD / ADHD Father    Mood Disorder Father    ADD / ADHD Sister    Mood Disorder Sister    Bipolar disorder Sister    ADD / ADHD Brother    Mood Disorder Brother    Bipolar disorder Brother    Depression Maternal Grandfather    Mood Disorder Paternal Grandfather    Mood Disorder Paternal Grandmother    ADD / ADHD Sister    Mood Disorder Sister    Bipolar disorder Sister    ADD / ADHD Brother    Mood Disorder Brother    Bipolar disorder Brother     Social History:  Social History   Socioeconomic History   Marital status: Single    Spouse name: Not on file   Number of children: Not on file   Years of education: Not on file   Highest education level: Not on file  Occupational History   Not on file  Tobacco Use   Smoking status: Never   Smokeless tobacco: Never  Vaping Use   Vaping Use: Never used  Substance and Sexual Activity   Alcohol use: No    Alcohol/week: 0.0 standard drinks of alcohol    Comment: 12-17-2015 per pt no    Drug use: No    Comment: 12-17-15 per pt no    Sexual activity: Never  Other Topics Concern   Not on file  Social History Narrative   Not on file   Social Determinants of Health   Financial Resource Strain: Not on file  Food Insecurity: Not on file  Transportation Needs: Not on file  Physical Activity: Not on file  Stress: Not on file  Social Connections: Not on file    Allergies:  Allergies  Allergen Reactions   Sulfa Antibiotics  Rash    Metabolic Disorder Labs: Lab Results  Component Value Date   HGBA1C 5.3 12/25/2018   No results found for: "PROLACTIN" No results found for: "CHOL", "TRIG", "HDL", "CHOLHDL", "VLDL", "[place]" Lab Results  Component Value Date   TSH 1.610 02/06/2019   TSH 1.490 12/25/2018    Therapeutic Level Labs: No results found for: "LITHIUM" Lab Results  Component Value Date   VALPROATE 106 (H) 05/18/2013   VALPROATE 64.9 03/10/2012   No results found for: "CBMZ"  Current Medications: Current Outpatient Medications  Medication Sig Dispense Refill   Accu-Chek Softclix Lancets lancets Check BS QID Dx E11.9 100 each 5   blood glucose meter kit and supplies Dispense based on patient and insurance preference. Use up to four times daily as directed. (FOR ICD-10 E10.9, E11.9). 1 each 0   buPROPion (WELLBUTRIN XL) 300 MG 24 hr tablet Take 1 tablet (300 mg total) by mouth every morning. 30 tablet 2   Cholecalciferol (VITAMIN D3) 125 MCG (5000 UT) CAPS Take 1 capsule (5,000 Units total) by mouth daily. 100 capsule 0   glucose blood (ACCU-CHEK GUIDE) test strip Check BS QID Dx E11.9 100 each 5   hydrOXYzine (VISTARIL) 100 MG capsule Take 1 capsule (100 mg total) by mouth 3 (three) times daily as needed. 90 capsule 2   Lancets Misc. (ACCU-CHEK FASTCLIX LANCET) KIT Check BS QID Dx E11.9 1 kit 0   lisdexamfetamine (VYVANSE) 70 MG capsule Take 1 capsule (70 mg total) by mouth daily. 30 capsule 0   lisdexamfetamine (VYVANSE) 70 MG capsule Take 1 capsule (70 mg total) by mouth daily. 30 capsule 0   lisdexamfetamine (VYVANSE) 70 MG capsule Take 1 capsule (70 mg total) by mouth daily. 30 capsule 0   meloxicam (MOBIC) 15 MG tablet TAKE ONE TABLET BY MOUTH DAILY 90 tablet 1   pantoprazole (PROTONIX) 40 MG tablet TAKE ONE TABLET BY MOUTH TWICE A DAY 60 tablet 5   traZODone (DESYREL) 100 MG tablet Take 1 tablet (100 mg total) by mouth at bedtime. 30 tablet 2   No current facility-administered  medications for this visit.     Musculoskeletal: Strength & Muscle Tone: na Gait & Station: na Patient leans: N/A  Psychiatric Specialty Exam: Review of Systems  All other systems reviewed and are negative.   There were no vitals taken for this visit.There is no height or weight on file to calculate BMI.  General Appearance: NA  Eye Contact:  NA  Speech:  Clear and Coherent  Volume:  Normal  Mood:  Euthymic  Affect:  NA  Thought Process:  Goal Directed  Orientation:  Full (Time, Place, and Person)  Thought Content: WDL   Suicidal Thoughts:  No  Homicidal Thoughts:  No  Memory:  Immediate;   Good Recent;   Good Remote;   Good  Judgement:  Good  Insight:  Good  Psychomotor Activity:  Normal  Concentration:  Concentration: Good and Attention Span: Good  Recall:  Good  Fund of Knowledge: Good  Language: Good  Akathisia:  No  Handed:  Right  AIMS (if indicated): not done  Assets:  Communication Skills Desire for Improvement Physical Health Resilience Social Support Talents/Skills  ADL's:  Intact  Cognition: WNL  Sleep:  Good   Screenings: PHQ2-9    Flowsheet Row Video Visit from 12/21/2021 in [facility] Video Visit from 09/23/2021 in [facility] Video Visit from 07/09/2021 in [facility] Video Visit from 03/12/2021 in [facility] Video Visit from 01/06/2021 in [facility]  PHQ-2 Total Score 2 0 4 0 1  PHQ-9 Total Score 4 -- 8 -- --      Flowsheet Row Video Visit from 12/21/2021 in [facility] [facility] Video Visit from 09/23/2021 in [facility] Video Visit from 07/09/2021 in [facility] No Risk No Risk No Risk         Assessment and Plan: This patient is a 26 year old male with history of bipolar disorder and ADHD.  He is doing well on his current regimen.  He will continue Vyvanse 70 mg every morning for ADD, Wellbutrin XL 300 mg daily for depression, hydroxyzine 100 mg 3 times daily as needed for anxiety and trazodone 100 mg at bedtime for sleep.  He will return to see me in 3 months  Collaboration of Care: Collaboration of Care: Primary Care Provider AEB notes will be shared with PCP at patient's request  Patient/Guardian was advised Release of Information must be obtained prior to any record release in order to collaborate their care with an outside provider. Patient/Guardian was advised if they have not already done so to contact the registration department to sign all necessary forms in order for [place] to release information regarding their care.   Consent: Patient/Guardian gives verbal consent for treatment and assignment of benefits for services provided during this visit. Patient/Guardian expressed understanding and agreed to proceed.    [name], MD 03/24/2022, 1:10 PM

## 2022-05-13 ENCOUNTER — Other Ambulatory Visit: Payer: Self-pay | Admitting: Family Medicine

## 2022-05-13 DIAGNOSIS — R5383 Other fatigue: Secondary | ICD-10-CM

## 2022-05-13 DIAGNOSIS — M2550 Pain in unspecified joint: Secondary | ICD-10-CM

## 2022-07-01 ENCOUNTER — Other Ambulatory Visit (HOSPITAL_COMMUNITY): Payer: Self-pay | Admitting: Psychiatry

## 2022-07-01 ENCOUNTER — Telehealth (HOSPITAL_COMMUNITY): Payer: Self-pay

## 2022-07-01 MED ORDER — LISDEXAMFETAMINE DIMESYLATE 70 MG PO CAPS
70.0000 mg | ORAL_CAPSULE | Freq: Every day | ORAL | 0 refills | Status: DC
Start: 2022-07-01 — End: 2022-07-15

## 2022-07-01 NOTE — Telephone Encounter (Signed)
Pt called in needing a refill on his lisdexamfetamine (VYVANSE) 70 MG capsule  sent in to [facility] on [address]. Pt is scheduled for 07/15/22. Please advise.

## 2022-07-01 NOTE — Telephone Encounter (Signed)
Spoke with pt advised medication has been sent pt verbalized understanding

## 2022-07-03 ENCOUNTER — Other Ambulatory Visit: Payer: Self-pay | Admitting: Physician Assistant

## 2022-07-06 ENCOUNTER — Other Ambulatory Visit: Payer: Self-pay | Admitting: Physician Assistant

## 2022-07-15 ENCOUNTER — Telehealth (INDEPENDENT_AMBULATORY_CARE_PROVIDER_SITE_OTHER): Payer: BC Managed Care – PPO | Admitting: Psychiatry

## 2022-07-15 ENCOUNTER — Encounter (HOSPITAL_COMMUNITY): Payer: Self-pay | Admitting: Psychiatry

## 2022-07-15 DIAGNOSIS — F3162 Bipolar disorder, current episode mixed, moderate: Secondary | ICD-10-CM

## 2022-07-15 DIAGNOSIS — F900 Attention-deficit hyperactivity disorder, predominantly inattentive type: Secondary | ICD-10-CM | POA: Diagnosis not present

## 2022-07-15 MED ORDER — HYDROXYZINE PAMOATE 100 MG PO CAPS: 100.0000 mg | ORAL_CAPSULE | Freq: Three times a day (TID) | ORAL | 2 refills | Status: DC | PRN

## 2022-07-15 MED ORDER — TRAZODONE HCL 100 MG PO TABS: 100.0000 mg | ORAL_TABLET | Freq: Every day | ORAL | 2 refills | Status: DC

## 2022-07-15 MED ORDER — LISDEXAMFETAMINE DIMESYLATE 70 MG PO CAPS: 70.0000 mg | ORAL_CAPSULE | Freq: Every day | ORAL | 0 refills | Status: DC

## 2022-07-15 MED ORDER — BUPROPION HCL ER (XL) 300 MG PO TB24: 300.0000 mg | ORAL_TABLET | ORAL | 2 refills | Status: DC

## 2022-07-15 NOTE — Progress Notes (Signed)
Virtual Visit via Telephone Note  I connected with Ryan [name] on 07/15/22 at 11:20 AM EDT by telephone and verified that I am speaking with the correct person using two identifiers.  Location: Patient: home Provider: office   I discussed the limitations, risks, security and privacy concerns of performing an evaluation and management service by telephone and the availability of in person appointments. I also discussed with the patient that there may be a patient responsible charge related to this service. The patient expressed understanding and agreed to proceed.      I discussed the assessment and treatment plan with the patient. The patient was provided an opportunity to ask questions and all were answered. The patient agreed with the plan and demonstrated an understanding of the instructions.   The patient was advised to call back or seek an in-person evaluation if the symptoms worsen or if the condition fails to improve as anticipated.  I provided 15 minutes of non-face-to-face time during this encounter.   [name], MD  [facility] MD/PA/NP OP Progress Note  07/15/2022 11:32 AM Ryan [name]  MRN:  [id number]  Chief Complaint:  Chief Complaint  Patient presents with   Anxiety   Depression   ADD   Follow-up   HPI: This patient is a 27 year old married white male who lives with his wife in [place].  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  The patient returns for follow-up after 4 months.  He states overall he is doing well.  He is enjoying his job and married life is doing him quite well.  His mood is good and he denies significant depression anxiety or thoughts of self harm.  He is not sleeping quite as well right now due to the time change.  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  His focus is not quite as good right now  but it may be because he is not getting enough sleep.  He would like to stay on the Vyvanse for now in hopes that things will improve when he gets used to the time change.  Visit Diagnosis:    ICD-10-CM   1. Attention deficit hyperactivity disorder (ADHD), predominantly inattentive type  F90.0     2. Bipolar 1 disorder, mixed, moderate (HCC)  F31.62       Past Psychiatric History: Long-term outpatient treatment  Past Medical History:  Past Medical History:  Diagnosis Date   Allergy    seasonal   Anxiety    Bipolar affective disorder ([place])    Depression    GERD (gastroesophageal reflux disease)    Seizures ([place])    seizures began in NOV 2013 ,last was 2014    Past Surgical History:  Procedure Laterality Date   TONSILLECTOMY      Family Psychiatric History: See below  Family History:  Family History  Problem Relation Age of Onset   ADD / ADHD Father    Mood Disorder Father    ADD / ADHD Sister    Mood Disorder Sister    Bipolar disorder Sister    ADD / ADHD Brother    Mood Disorder Brother    Bipolar disorder Brother    Depression Maternal Grandfather    Mood Disorder Paternal Grandfather    Mood Disorder Paternal 44    ADD / ADHD Sister  Mood Disorder Sister    Bipolar disorder Sister    ADD / ADHD Brother    Mood Disorder Brother    Bipolar disorder Brother     Social History:  Social History   Socioeconomic History   Marital status: Single    Spouse name: Not on file   Number of children: Not on file   Years of education: Not on file   Highest education level: Not on file  Occupational History   Not on file  Tobacco Use   Smoking status: Never   Smokeless tobacco: Never  Vaping Use   Vaping Use: Never used  Substance and Sexual Activity   Alcohol use: No    Alcohol/week: 0.0 standard drinks of alcohol    Comment: 12-17-2015 per pt no    Drug use: No    Comment: 12-17-15 per pt no    Sexual activity: Never  Other Topics Concern    Not on file  Social History Narrative   Not on file   Social Determinants of Health   Financial Resource Strain: Not on file  Food Insecurity: Not on file  Transportation Needs: Not on file  Physical Activity: Not on file  Stress: Not on file  Social Connections: Not on file    Allergies:  Allergies  Allergen Reactions   Sulfa Antibiotics Rash    Metabolic Disorder Labs: Lab Results  Component Value Date   HGBA1C 5.3 12/25/2018   No results found for: "PROLACTIN" No results found for: "CHOL", "TRIG", "HDL", "CHOLHDL", "VLDL", "[place]" Lab Results  Component Value Date   TSH 1.610 02/06/2019   TSH 1.490 12/25/2018    Therapeutic Level Labs: No results found for: "LITHIUM" Lab Results  Component Value Date   VALPROATE 106 (H) 05/18/2013   VALPROATE 64.9 03/10/2012   No results found for: "CBMZ"  Current Medications: Current Outpatient Medications  Medication Sig Dispense Refill   Accu-Chek Softclix Lancets lancets Check BS QID Dx E11.9 100 each 5   blood glucose meter kit and supplies Dispense based on patient and insurance preference. Use up to four times daily as directed. (FOR ICD-10 E10.9, E11.9). 1 each 0   buPROPion (WELLBUTRIN XL) 300 MG 24 hr tablet Take 1 tablet (300 mg total) by mouth every morning. 30 tablet 2   Cholecalciferol (VITAMIN D3) 125 MCG (5000 UT) CAPS Take 1 capsule (5,000 Units total) by mouth daily. 100 capsule 0   glucose blood (ACCU-CHEK GUIDE) test strip Check BS QID Dx E11.9 100 each 5   hydrOXYzine (VISTARIL) 100 MG capsule Take 1 capsule (100 mg total) by mouth 3 (three) times daily as needed. 90 capsule 2   Lancets Misc. (ACCU-CHEK FASTCLIX LANCET) KIT Check BS QID Dx E11.9 1 kit 0   lisdexamfetamine (VYVANSE) 70 MG capsule Take 1 capsule (70 mg total) by mouth daily. 30 capsule 0   lisdexamfetamine (VYVANSE) 70 MG capsule Take 1 capsule (70 mg total) by mouth daily. 30 capsule 0   lisdexamfetamine (VYVANSE) 70 MG capsule Take 1  capsule (70 mg total) by mouth daily. 30 capsule 0   meloxicam (MOBIC) 15 MG tablet TAKE ONE TABLET BY MOUTH DAILY 90 tablet 1   pantoprazole (PROTONIX) 40 MG tablet TAKE ONE TABLET BY MOUTH TWICE A DAY 60 tablet 5   traZODone (DESYREL) 100 MG tablet Take 1 tablet (100 mg total) by mouth at bedtime. 30 tablet 2   No current facility-administered medications for this visit.     Musculoskeletal: Strength &  Muscle Tone: na Gait & Station: na Patient leans: N/A  Psychiatric Specialty Exam: Review of Systems  Constitutional:  Positive for fatigue.  Musculoskeletal:  Positive for myalgias.    There were no vitals taken for this visit.There is no height or weight on file to calculate BMI.  General Appearance: NA  Eye Contact:  NA  Speech:  Clear and Coherent  Volume:  Normal  Mood:  Euthymic  Affect:  NA  Thought Process:  Goal Directed  Orientation:  Full (Time, Place, and Person)  Thought Content: WDL   Suicidal Thoughts:  No  Homicidal Thoughts:  No  Memory:  Immediate;   Good Recent;   Good Remote;   Good  Judgement:  Good  Insight:  Good  Psychomotor Activity:  Normal  Concentration:  Concentration: Fair and Attention Span: Fair  Recall:  Good  Fund of Knowledge: Good  Language: Good  Akathisia:  No  Handed:  Right  AIMS (if indicated): not done  Assets:  Communication Skills Desire for Improvement Physical Health Resilience Social Support Talents/Skills Vocational/Educational  ADL's:  Intact  Cognition: WNL  Sleep:  Fair   Screenings: PHQ2-9    Flowsheet Row Video Visit from 12/21/2021 in [facility] Video Visit from 09/23/2021 in [facility] [place] Video Visit from 07/09/2021 in [facility] Video Visit from 03/12/2021 in [facility] Video Visit from 01/06/2021 in [facility] Total Score 2 0 4 0 1  PHQ-9 Total Score 4 -- 8 -- --      Flowsheet Row Video Visit from 12/21/2021 in [facility] [place] Video Visit from 09/23/2021 in [facility] at [place] Video Visit from 07/09/2021 in [facility] No Risk No Risk No Risk        Assessment and Plan: This patient is a 27 year old male with a history of bipolar disorder and ADHD.  He continues to do well on his current regimen for the most part.  He will continue Vyvanse 70 mg every morning for ADHD, Wellbutrin XL 300 mg daily for depression, hydroxyzine 100 mg 3 times daily as needed for anxiety and trazodone 100 mg at bedtime for sleep.  He will return to see me in 3 months  Collaboration of Care: Collaboration of Care: Primary Care Provider AEB notes will be shared with PCP at patient's request  Patient/Guardian was advised Release of Information must be obtained prior to any record release in order to collaborate their care with an outside provider. Patient/Guardian was advised if they have not already done so to contact the registration department to sign all necessary forms in order for [place] to release information regarding their care.   Consent: Patient/Guardian gives verbal consent for treatment and assignment of benefits for services provided during this visit. Patient/Guardian expressed understanding and agreed to proceed.    [name], MD 07/15/2022, 11:32 AM

## 2022-07-23 ENCOUNTER — Telehealth: Payer: Self-pay | Admitting: Internal Medicine

## 2022-07-23 MED ORDER — PANTOPRAZOLE SODIUM 40 MG PO TBEC: 40.0000 mg | DELAYED_RELEASE_TABLET | Freq: Two times a day (BID) | ORAL | 0 refills | Status: DC

## 2022-07-23 NOTE — Telephone Encounter (Signed)
I informed Ryan [name] that the rx has been sent. She said Thank You.

## 2022-07-23 NOTE — Telephone Encounter (Signed)
PT mother is calling so he can get a refill for pantoprazole. I advised he may need to be seen in office but she wanted to speak to nurse concerning that. RX should be sent to [facility] on [address]

## 2022-07-23 NOTE — Telephone Encounter (Signed)
I sent an Rx

## 2022-07-23 NOTE — Telephone Encounter (Signed)
I called and spoke with his Mom. She said Ryan [name] has been out of his medicine for 2 weeks and is already having problems. He was last seen in 01/2020. They have made an appointment for 10/07/22. Mom is requesting I send in a refill to cover him until then. Please advise Sir. He takes the pantoprazole 40mg  BID. Thank you.

## 2022-08-07 ENCOUNTER — Telehealth: Payer: BC Managed Care – PPO | Admitting: Nurse Practitioner

## 2022-08-07 DIAGNOSIS — U071 COVID-19: Secondary | ICD-10-CM

## 2022-08-07 MED ORDER — MOLNUPIRAVIR EUA 200MG CAPSULE: 4.0000 | ORAL_CAPSULE | Freq: Two times a day (BID) | ORAL | 0 refills | Status: AC

## 2022-08-07 NOTE — Progress Notes (Signed)
Video connection was lost when less than 50% of the duration of the visit was complete, at which time the remainder of the visit was completed via audio only.   Virtual Visit Consent   Ryan [name], you are scheduled for a virtual visit with a [facility] provider today. Just as with appointments in the office, your consent must be obtained to participate. Your consent will be active for this visit and any virtual visit you may have with one of our providers in the next 365 days. If you have a MyChart account, a copy of this consent can be sent to you electronically.  As this is a virtual visit, video technology does not allow for your provider to perform a traditional examination. This may limit your provider's ability to fully assess your condition. If your provider identifies any concerns that need to be evaluated in person or the need to arrange testing (such as labs, EKG, etc.), we will make arrangements to do so. Although advances in technology are sophisticated, we cannot ensure that it will always work on either your end or our end. If the connection with a video visit is poor, the visit may have to be switched to a telephone visit. With either a video or telephone visit, we are not always able to ensure that we have a secure connection.  By engaging in this virtual visit, you consent to the provision of healthcare and authorize for your insurance to be billed (if applicable) for the services provided during this visit. Depending on your insurance coverage, you may receive a charge related to this service.  I need to obtain your verbal consent now. Are you willing to proceed with your visit today? Ryan [name] has provided verbal consent on 08/07/2022 for a virtual visit (video or telephone). [name], NP  Date: 08/07/2022 3:15 PM  Virtual Visit via Video Note   I, [name], connected with  Ryan [name]  ([id number], Jan 27, 1996) on 08/07/22 at  2:30 PM EDT by a video-enabled  telemedicine application and verified that I am speaking with the correct person using two identifiers.  Location: Patient: Virtual Visit Location Patient: Home Provider: Virtual Visit Location Provider: Home Office   I discussed the limitations of evaluation and management by telemedicine and the availability of in person appointments. The patient expressed understanding and agreed to proceed.    History of Present Illness: Ryan [name] is a 27 y.o. who identifies as a male who was assigned male at birth, and is being seen today for POSITIVE HOME COVID TEST.  Ryan [name] states his wife is currently being treated for COVID with paxlovid.  He started having symptoms of sinus pressure, chest congestion, shortness of breath with exertion this morning and tested positive for COVID as well today. Requesting paxlovid.   Problems:  Patient Active Problem List   Diagnosis Date Noted   GERD (gastroesophageal reflux disease) 12/26/2019   COVID-19 virus infection 12/26/2019   Vitamin D deficiency 05/29/2019    Allergies:  Allergies  Allergen Reactions   Sulfa Antibiotics Rash   Medications:  Current Outpatient Medications:    molnupiravir EUA (LAGEVRIO) 200 mg CAPS capsule, Take 4 capsules (800 mg total) by mouth 2 (two) times daily for 5 days., Disp: 40 capsule, Rfl: 0   Accu-Chek Softclix Lancets lancets, Check BS QID Dx E11.9, Disp: 100 each, Rfl: 5   blood glucose meter kit and supplies, Dispense based on patient and insurance preference. Use up  to four times daily as directed. (FOR ICD-10 E10.9, E11.9)., Disp: 1 each, Rfl: 0   buPROPion (WELLBUTRIN XL) 300 MG 24 hr tablet, Take 1 tablet (300 mg total) by mouth every morning., Disp: 30 tablet, Rfl: 2   Cholecalciferol (VITAMIN D3) 125 MCG (5000 UT) CAPS, Take 1 capsule (5,000 Units total) by mouth daily., Disp: 100 capsule, Rfl: 0   glucose blood (ACCU-CHEK GUIDE) test strip, Check BS QID Dx E11.9, Disp: 100 each, Rfl: 5   hydrOXYzine  (VISTARIL) 100 MG capsule, Take 1 capsule (100 mg total) by mouth 3 (three) times daily as needed., Disp: 90 capsule, Rfl: 2   Lancets Misc. (ACCU-CHEK FASTCLIX LANCET) KIT, Check BS QID Dx E11.9, Disp: 1 kit, Rfl: 0   lisdexamfetamine (VYVANSE) 70 MG capsule, Take 1 capsule (70 mg total) by mouth daily., Disp: 30 capsule, Rfl: 0   lisdexamfetamine (VYVANSE) 70 MG capsule, Take 1 capsule (70 mg total) by mouth daily., Disp: 30 capsule, Rfl: 0   lisdexamfetamine (VYVANSE) 70 MG capsule, Take 1 capsule (70 mg total) by mouth daily., Disp: 30 capsule, Rfl: 0   meloxicam (MOBIC) 15 MG tablet, TAKE ONE TABLET BY MOUTH DAILY, Disp: 90 tablet, Rfl: 1   pantoprazole (PROTONIX) 40 MG tablet, Take 1 tablet (40 mg total) by mouth 2 (two) times daily., Disp: 180 tablet, Rfl: 0   traZODone (DESYREL) 100 MG tablet, Take 1 tablet (100 mg total) by mouth at bedtime., Disp: 30 tablet, Rfl: 2  Observations/Objective: Patient is well-developed, well-nourished in no acute distress.  Resting comfortably at home.  Head is normocephalic, atraumatic.  No labored breathing.  Speech is clear and coherent with logical content.  Patient is alert and oriented at baseline.    Assessment and Plan: 1. Positive self-administered antigen test for COVID-19 - molnupiravir EUA (LAGEVRIO) 200 mg CAPS capsule; Take 4 capsules (800 mg total) by mouth 2 (two) times daily for 5 days.  Dispense: 40 capsule; Refill: 0  No recent labs on file for Cr+. Will send molnupiravir instead of paxlovid.   INSTRUCTIONS: use a humidifier for nasal congestion Drink plenty of fluids, rest and wash hands frequently to avoid the spread of infection Alternate tylenol and Motrin for relief of fever   Follow Up Instructions: I discussed the assessment and treatment plan with the patient. The patient was provided an opportunity to ask questions and all were answered. The patient agreed with the plan and demonstrated an understanding of the  instructions.  A copy of instructions were sent to the patient via MyChart unless otherwise noted below.    The patient was advised to call back or seek an in-person evaluation if the symptoms worsen or if the condition fails to improve as anticipated.  Time:  I spent 11 minutes with the patient via telehealth technology discussing the above problems/concerns.    Claiborne RiggZelda W Jene Oravec, NP

## 2022-08-07 NOTE — Patient Instructions (Signed)
Ryan [name], thank you for joining Ryan Rigg, NP for today's virtual visit.  While this provider is not your primary care provider (PCP), if your PCP is located in our provider database this encounter information will be shared with them immediately following your visit.   A [facility] MyChart account gives you access to today's visit and all your visits, tests, and labs performed at [facility] " click here if you don't have a [name] account or go to [email or web address]  Consent: (Patient) Ryan [name] provided verbal consent for this virtual visit at the beginning of the encounter.  Current Medications:  Current Outpatient Medications:    molnupiravir EUA (LAGEVRIO) 200 mg CAPS capsule, Take 4 capsules (800 mg total) by mouth 2 (two) times daily for 5 days., Disp: 40 capsule, Rfl: 0   Accu-Chek Softclix Lancets lancets, Check BS QID Dx E11.9, Disp: 100 each, Rfl: 5   blood glucose meter kit and supplies, Dispense based on patient and insurance preference. Use up to four times daily as directed. (FOR ICD-10 E10.9, E11.9)., Disp: 1 each, Rfl: 0   buPROPion (WELLBUTRIN XL) 300 MG 24 hr tablet, Take 1 tablet (300 mg total) by mouth every morning., Disp: 30 tablet, Rfl: 2   Cholecalciferol (VITAMIN D3) 125 MCG (5000 UT) CAPS, Take 1 capsule (5,000 Units total) by mouth daily., Disp: 100 capsule, Rfl: 0   glucose blood (ACCU-CHEK GUIDE) test strip, Check BS QID Dx E11.9, Disp: 100 each, Rfl: 5   hydrOXYzine (VISTARIL) 100 MG capsule, Take 1 capsule (100 mg total) by mouth 3 (three) times daily as needed., Disp: 90 capsule, Rfl: 2   Lancets Misc. (ACCU-CHEK FASTCLIX LANCET) KIT, Check BS QID Dx E11.9, Disp: 1 kit, Rfl: 0   lisdexamfetamine (VYVANSE) 70 MG capsule, Take 1 capsule (70 mg total) by mouth daily., Disp: 30 capsule, Rfl: 0   lisdexamfetamine (VYVANSE) 70 MG capsule, Take 1 capsule (70 mg total) by mouth daily., Disp: 30 capsule, Rfl: 0    lisdexamfetamine (VYVANSE) 70 MG capsule, Take 1 capsule (70 mg total) by mouth daily., Disp: 30 capsule, Rfl: 0   meloxicam (MOBIC) 15 MG tablet, TAKE ONE TABLET BY MOUTH DAILY, Disp: 90 tablet, Rfl: 1   pantoprazole (PROTONIX) 40 MG tablet, Take 1 tablet (40 mg total) by mouth 2 (two) times daily., Disp: 180 tablet, Rfl: 0   traZODone (DESYREL) 100 MG tablet, Take 1 tablet (100 mg total) by mouth at bedtime., Disp: 30 tablet, Rfl: 2   Medications ordered in this encounter:  Meds ordered this encounter  Medications   molnupiravir EUA (LAGEVRIO) 200 mg CAPS capsule    Sig: Take 4 capsules (800 mg total) by mouth 2 (two) times daily for 5 days.    Dispense:  40 capsule    Refill:  0    Order Specific Question:   Supervising Provider    Answer:   Merrilee Jansky [id number]     *If you need refills on other medications prior to your next appointment, please contact your pharmacy*  Follow-Up: Call back or seek an in-person evaluation if the symptoms worsen or if the condition fails to improve as anticipated.  [facility] [phone]  Other Instructions INSTRUCTIONS: use a humidifier for nasal congestion Drink plenty of fluids, rest and wash hands frequently to avoid the spread of infection Alternate tylenol and Motrin for relief of fever    If you have been instructed to have an in-person evaluation  today at a local Urgent Care facility, please use the link below. It will take you to a list of all of our available [facility], including address, phone number and hours of operation. Please do not delay care.  [facility]  If you or a family member do not have a primary care provider, use the link below to schedule a visit and establish care. When you choose a [facility] primary care physician or advanced practice provider, you gain a long-term partner in health. Find a Primary Care Provider  Learn more about [facility]'s in-office and virtual  care options: [facility] Get Care Now

## 2022-08-30 ENCOUNTER — Telehealth: Payer: Self-pay | Admitting: Family Medicine

## 2022-08-30 NOTE — Telephone Encounter (Signed)
I put him in the 2:35 for Friday with Dr. Louanne Skye. Please call back and make aware. Thank you.

## 2022-08-30 NOTE — Telephone Encounter (Signed)
PT IS AWARE OF APPT 

## 2022-09-01 ENCOUNTER — Ambulatory Visit: Payer: Medicaid Other | Admitting: Family Medicine

## 2022-09-03 ENCOUNTER — Encounter: Payer: Self-pay | Admitting: Family Medicine

## 2022-09-03 ENCOUNTER — Ambulatory Visit (INDEPENDENT_AMBULATORY_CARE_PROVIDER_SITE_OTHER): Payer: BC Managed Care – PPO | Admitting: Family Medicine

## 2022-09-03 VITALS — BP 131/81 | HR 110 | Temp 98.0°F | Ht 63.0 in | Wt 169.0 lb

## 2022-09-03 DIAGNOSIS — M2550 Pain in unspecified joint: Secondary | ICD-10-CM

## 2022-09-03 DIAGNOSIS — U099 Post covid-19 condition, unspecified: Secondary | ICD-10-CM

## 2022-09-03 DIAGNOSIS — R5383 Other fatigue: Secondary | ICD-10-CM

## 2022-09-03 DIAGNOSIS — G629 Polyneuropathy, unspecified: Secondary | ICD-10-CM | POA: Diagnosis not present

## 2022-09-03 MED ORDER — MELOXICAM 15 MG PO TABS: 15.0000 mg | ORAL_TABLET | Freq: Every day | ORAL | 3 refills | Status: DC

## 2022-09-03 NOTE — Progress Notes (Signed)
BP 131/81   Pulse (!) 110   Temp 98 F (36.7 C)   Ht 5\' 3"  (1.6 m)   Wt 169 lb (76.7 kg)   SpO2 97%   BMI 29.94 kg/m    Subjective:   Patient ID: [name], male    DOB: Nov 22, 1995, 27 y.o.   MRN: [id number]  HPI: Ryan [name] is a 27 y.o. male presenting on 09/03/2022 for nerve pain (Arms and hands) and Fatigue (Started after having Covid in 2020. )   HPI Patient comes in today complaining that his fatigue has been worsening that he started having after COVID in 2020, and had been improving but he says it has been worsening over the past 6 or 7 months but that is also been complaining of this nerve pain that goes down the lateral aspect of his arms onto his posterior hands on the lateral aspect.  He says that will come and go and is not every day but has been getting more frequent.  He did state that he had COVID again recently in March and thinks this may have triggered some things again.  He says sometimes he will get chest tightness and difficulty breathing when he is walking or exerting himself that comes along with it.  Relevant past medical, surgical, family and social history reviewed and updated as indicated. Interim medical history since our last visit reviewed. Allergies and medications reviewed and updated.  Review of Systems  Constitutional:  Positive for fatigue. Negative for chills and fever.  Eyes:  Negative for visual disturbance.  Respiratory:  Positive for shortness of breath. Negative for wheezing.   Cardiovascular:  Negative for chest pain and leg swelling.  Musculoskeletal:  Positive for arthralgias and myalgias. Negative for back pain and gait problem.  Skin:  Negative for rash.  Neurological:  Positive for numbness. Negative for dizziness, weakness and light-headedness.  All other systems reviewed and are negative.   Per HPI unless specifically indicated above   Allergies as of 09/03/2022       Reactions   Sulfa Antibiotics Rash        Medication  List        Accurate as of Sep 03, 2022  3:30 PM. If you have any questions, ask your nurse or doctor.          STOP taking these medications    Accu-Chek FastClix Lancet Kit Stopped by: Nils Pyle, MD   Accu-Chek Guide test strip Generic drug: glucose blood Stopped by: Nils Pyle, MD   Accu-Chek Softclix Lancets lancets Stopped by: Elige Radon Jerrine Urschel, MD   blood glucose meter kit and supplies Stopped by: Elige Radon Rusti Arizmendi, MD       TAKE these medications    buPROPion 300 MG 24 hr tablet Commonly known as: Wellbutrin XL Take 1 tablet (300 mg total) by mouth every morning.   hydrOXYzine 100 MG capsule Commonly known as: VISTARIL Take 1 capsule (100 mg total) by mouth 3 (three) times daily as needed.   lisdexamfetamine 70 MG capsule Commonly known as: Vyvanse Take 1 capsule (70 mg total) by mouth daily.   lisdexamfetamine 70 MG capsule Commonly known as: Vyvanse Take 1 capsule (70 mg total) by mouth daily.   lisdexamfetamine 70 MG capsule Commonly known as: Vyvanse Take 1 capsule (70 mg total) by mouth daily.   meloxicam 15 MG tablet Commonly known as: MOBIC Take 1 tablet (15 mg total) by mouth daily.   pantoprazole 40  MG tablet Commonly known as: PROTONIX Take 1 tablet (40 mg total) by mouth 2 (two) times daily.   traZODone 100 MG tablet Commonly known as: DESYREL Take 1 tablet (100 mg total) by mouth at bedtime.   Vitamin D3 125 MCG (5000 UT) Caps Take 1 capsule (5,000 Units total) by mouth daily.         Objective:   BP 131/81   Pulse (!) 110   Temp 98 F (36.7 C)   Ht 5\' 3"  (1.6 m)   Wt 169 lb (76.7 kg)   SpO2 97%   BMI 29.94 kg/m   Wt Readings from Last 3 Encounters:  09/03/22 169 lb (76.7 kg)  09/19/20 165 lb (74.8 kg)  01/02/20 171 lb (77.6 kg)    Physical Exam Vitals and nursing note reviewed.  Constitutional:      General: He is not in acute distress.    Appearance: He is well-developed. He is not  diaphoretic.  Eyes:     General: No scleral icterus.    Conjunctiva/sclera: Conjunctivae normal.  Neck:     Thyroid: No thyromegaly.  Cardiovascular:     Rate and Rhythm: Normal rate and regular rhythm.     Heart sounds: Normal heart sounds. No murmur heard. Pulmonary:     Effort: Pulmonary effort is normal. No respiratory distress.     Breath sounds: Normal breath sounds. No wheezing.  Musculoskeletal:        General: No swelling. Normal range of motion.     Cervical back: Neck supple.  Lymphadenopathy:     Cervical: No cervical adenopathy.  Skin:    General: Skin is warm and dry.     Findings: No rash.  Neurological:     Mental Status: He is alert and oriented to person, place, and time.     Coordination: Coordination normal.  Psychiatric:        Behavior: Behavior normal.     Assessment & Plan:   Problem List Items Addressed This Visit   None Visit Diagnoses     Post-COVID syndrome    -  Primary   Neuropathy       Arthralgia of multiple joints       Relevant Medications   meloxicam (MOBIC) 15 MG tablet   Fatigue, unspecified type       Relevant Medications   meloxicam (MOBIC) 15 MG tablet       Give a sample for air supra that he can use as needed to help with inflammation in his lungs to help breathe. Gave meloxicam as well to help with anti-inflammatory and if does not improve then he can let me know.  He is trying to improve his diet   Follow up plan: Return if symptoms worsen or fail to improve.  Counseling provided for all of the vaccine components No orders of the defined types were placed in this encounter.   Arville Care, MD [facility] 09/03/2022, 3:30 PM    Like a tingling and burning sensation in that part of his hand.  He did state that he had COVID again

## 2022-10-07 ENCOUNTER — Ambulatory Visit: Payer: BC Managed Care – PPO | Admitting: Internal Medicine

## 2022-10-07 ENCOUNTER — Encounter: Payer: Self-pay | Admitting: Internal Medicine

## 2022-10-07 VITALS — BP 100/64 | HR 104 | Ht 63.0 in | Wt 168.4 lb

## 2022-10-07 DIAGNOSIS — K219 Gastro-esophageal reflux disease without esophagitis: Secondary | ICD-10-CM | POA: Diagnosis not present

## 2022-10-07 MED ORDER — PANTOPRAZOLE SODIUM 40 MG PO TBEC: 40.0000 mg | DELAYED_RELEASE_TABLET | Freq: Two times a day (BID) | ORAL | 3 refills | Status: DC

## 2022-10-07 NOTE — Progress Notes (Signed)
   MAHLIK HLADKY 27 y.o. [birth year] [id number]  Assessment & Plan:   Encounter Diagnosis  Name Primary?   Gastroesophageal reflux disease, unspecified whether esophagitis present Yes   Patient is doing well.  Refill PPI, suggested he try taking it daily instead of twice daily.  Further refills through primary care see GI as needed.    Subjective:   Chief Complaint GERD, medication refill  HPI 27 year old white man with a history of GERD diagnosed by history with excellent response to PPI.  He had a normal EGD in September 2021 on PPI.  No dysphagia and no breakthrough symptoms. Allergies  Allergen Reactions   Sulfa Antibiotics Rash   Current Meds  Medication Sig   buPROPion (WELLBUTRIN XL) 300 MG 24 hr tablet Take 1 tablet (300 mg total) by mouth every morning.   Cholecalciferol (VITAMIN D3) 125 MCG (5000 UT) CAPS Take 1 capsule (5,000 Units total) by mouth daily.   hydrOXYzine (VISTARIL) 100 MG capsule Take 1 capsule (100 mg total) by mouth 3 (three) times daily as needed.   lisdexamfetamine (VYVANSE) 70 MG capsule Take 70 mg by mouth daily.   meloxicam (MOBIC) 15 MG tablet Take 1 tablet (15 mg total) by mouth daily.   traZODone (DESYREL) 100 MG tablet Take 1 tablet (100 mg total) by mouth at bedtime.   [DISCONTINUED] pantoprazole (PROTONIX) 40 MG tablet Take 1 tablet (40 mg total) by mouth 2 (two) times daily.   Past Medical History:  Diagnosis Date   Allergy    seasonal   Anxiety    Bipolar affective disorder (HCC)    Depression    GERD (gastroesophageal reflux disease)    Seizures (HCC)    seizures began in NOV 2013 ,last was 2014   Past Surgical History:  Procedure Laterality Date   TONSILLECTOMY     Social History   Social History Narrative   Married   Employed full-time   No alcohol tobacco or drug use   family history includes ADD / ADHD in his brother, brother, father, sister, and sister; Bipolar disorder in his brother, brother, sister, and sister;  Depression in his maternal grandfather; Mood Disorder in his brother, brother, father, paternal grandfather, paternal grandmother, sister, and sister.   Review of Systems  As above Objective:   Physical Exam BP 100/64 (BP Location: Left Arm, Patient Position: Sitting, Cuff Size: Normal)   Pulse (!) 104   Ht 5\' 3"  (1.6 m)   Wt 168 lb 6 oz (76.4 kg)   BMI 29.83 kg/m

## 2022-10-07 NOTE — Patient Instructions (Signed)
We have sent the following medications to your pharmacy for you to pick up at your convenience: Pantoprazole  Try to only take one pantoprazole in the morning. You may refill this thru your PCP.   I appreciate the opportunity to care for you. Stan Head, MD, [facility]

## 2022-10-18 ENCOUNTER — Telehealth (INDEPENDENT_AMBULATORY_CARE_PROVIDER_SITE_OTHER): Payer: BC Managed Care – PPO | Admitting: Psychiatry

## 2022-10-18 ENCOUNTER — Encounter (HOSPITAL_COMMUNITY): Payer: Self-pay | Admitting: Psychiatry

## 2022-10-18 DIAGNOSIS — F900 Attention-deficit hyperactivity disorder, predominantly inattentive type: Secondary | ICD-10-CM | POA: Diagnosis not present

## 2022-10-18 DIAGNOSIS — F3162 Bipolar disorder, current episode mixed, moderate: Secondary | ICD-10-CM

## 2022-10-18 MED ORDER — HYDROXYZINE PAMOATE 100 MG PO CAPS: 100.0000 mg | ORAL_CAPSULE | Freq: Three times a day (TID) | ORAL | 2 refills | Status: DC | PRN

## 2022-10-18 MED ORDER — LISDEXAMFETAMINE DIMESYLATE 70 MG PO CAPS: 70.0000 mg | ORAL_CAPSULE | Freq: Every day | ORAL | 0 refills | Status: DC

## 2022-10-18 MED ORDER — TRAZODONE HCL 100 MG PO TABS: 100.0000 mg | ORAL_TABLET | Freq: Every day | ORAL | 2 refills | Status: DC

## 2022-10-18 MED ORDER — BUPROPION HCL ER (XL) 300 MG PO TB24: 300.0000 mg | ORAL_TABLET | ORAL | 2 refills | Status: DC

## 2022-10-18 MED ORDER — LISDEXAMFETAMINE DIMESYLATE 70 MG PO CAPS
70.0000 mg | ORAL_CAPSULE | Freq: Every day | ORAL | 0 refills | Status: DC
Start: 2022-10-18 — End: 2023-02-15

## 2022-10-18 NOTE — Progress Notes (Signed)
Virtual Visit via Video Note  I connected with [name] on 10/18/22 at  3:20 PM EDT by a video enabled telemedicine application and verified that I am speaking with the correct person using two identifiers.  Location: Patient: home Provider: office   I discussed the limitations of evaluation and management by telemedicine and the availability of in person appointments. The patient expressed understanding and agreed to proceed.     I discussed the assessment and treatment plan with the patient. The patient was provided an opportunity to ask questions and all were answered. The patient agreed with the plan and demonstrated an understanding of the instructions.   The patient was advised to call back or seek an in-person evaluation if the symptoms worsen or if the condition fails to improve as anticipated.  I provided 15 minutes of non-face-to-face time during this encounter.   Diannia Ruder, MD  [facility] MD/PA/NP OP Progress Note  10/18/2022 3:31 PM ANIVAL SALADO  MRN:  [id number]  Chief Complaint:  Chief Complaint  Patient presents with   Depression   Anxiety   Follow-up   ADD   HPI: This patient is a 27 year old married white male who lives with his wife in [place].  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  Unfortunately since I last saw him he contracted COVID again in April.  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  He is dealing with muscle aches and fatigue.  He is on meloxicam and uses an inhaler as needed.  He denies significant effects however on his mood.  He denies being depressed and the hydroxyzine helps his anxiety.  He is sleeping well, at times too much.  His energy is not the greatest right now.  Nevertheless he is able to do things with his family and his wife.  He denies thoughts of self-harm.  He is focusing well and staying alert with the Vyvanse Visit Diagnosis:     ICD-10-CM   1. Attention deficit hyperactivity disorder (ADHD), predominantly inattentive type  F90.0     2. Bipolar 1 disorder, mixed, moderate (HCC)  F31.62       Past Psychiatric History: Long-term outpatient treatment  Past Medical History:  Past Medical History:  Diagnosis Date   Allergy    seasonal   Anxiety    Bipolar affective disorder (HCC)    Depression    GERD (gastroesophageal reflux disease)    Seizures (HCC)    seizures began in NOV 2013 ,last was 2014    Past Surgical History:  Procedure Laterality Date   TONSILLECTOMY      Family Psychiatric History: See below  Family History:  Family History  Problem Relation Age of Onset   ADD / ADHD Father    Mood Disorder Father    ADD / ADHD Sister    Mood Disorder Sister    Bipolar disorder Sister    ADD / ADHD Brother    Mood Disorder Brother    Bipolar disorder Brother    Depression Maternal Grandfather    Mood Disorder Paternal Grandfather    Mood Disorder Paternal Grandmother    ADD / ADHD Sister    Mood Disorder Sister    Bipolar disorder Sister    ADD / ADHD Brother    Mood Disorder Brother    Bipolar disorder Brother     Social History:  Social History   Socioeconomic  History   Marital status: Married    Spouse name: Not on file   Number of children: Not on file   Years of education: Not on file   Highest education level: Not on file  Occupational History   Not on file  Tobacco Use   Smoking status: Never   Smokeless tobacco: Never  Vaping Use   Vaping Use: Never used  Substance and Sexual Activity   Alcohol use: No    Alcohol/week: 0.0 standard drinks of alcohol    Comment: 12-17-2015 per pt no    Drug use: No    Comment: 12-17-15 per pt no    Sexual activity: Never  Other Topics Concern   Not on file  Social History Narrative   Married   Employed full-time   No alcohol tobacco or drug use   Social Determinants of Corporate investment banker Strain: Not on file  Food  Insecurity: Not on file  Transportation Needs: Not on file  Physical Activity: Not on file  Stress: Not on file  Social Connections: Not on file    Allergies:  Allergies  Allergen Reactions   Sulfa Antibiotics Rash    Metabolic Disorder Labs: Lab Results  Component Value Date   HGBA1C 5.3 12/25/2018   No results found for: "PROLACTIN" No results found for: "CHOL", "TRIG", "HDL", "CHOLHDL", "VLDL", "LDLCALC" Lab Results  Component Value Date   TSH 1.610 02/06/2019   TSH 1.490 12/25/2018    Therapeutic Level Labs: No results found for: "LITHIUM" Lab Results  Component Value Date   VALPROATE 106 (H) 05/18/2013   VALPROATE 64.9 03/10/2012   No results found for: "CBMZ"  Current Medications: Current Outpatient Medications  Medication Sig Dispense Refill   buPROPion (WELLBUTRIN XL) 300 MG 24 hr tablet Take 1 tablet (300 mg total) by mouth every morning. 30 tablet 2   Cholecalciferol (VITAMIN D3) 125 MCG (5000 UT) CAPS Take 1 capsule (5,000 Units total) by mouth daily. 100 capsule 0   hydrOXYzine (VISTARIL) 100 MG capsule Take 1 capsule (100 mg total) by mouth 3 (three) times daily as needed. 90 capsule 2   lisdexamfetamine (VYVANSE) 70 MG capsule Take 1 capsule (70 mg total) by mouth daily. 30 capsule 0   lisdexamfetamine (VYVANSE) 70 MG capsule Take 1 capsule (70 mg total) by mouth daily. 30 capsule 0   lisdexamfetamine (VYVANSE) 70 MG capsule Take 1 capsule (70 mg total) by mouth daily. 30 capsule 0   meloxicam (MOBIC) 15 MG tablet Take 1 tablet (15 mg total) by mouth daily. 90 tablet 3   pantoprazole (PROTONIX) 40 MG tablet Take 1 tablet (40 mg total) by mouth 2 (two) times daily. 180 tablet 3   traZODone (DESYREL) 100 MG tablet Take 1 tablet (100 mg total) by mouth at bedtime. 30 tablet 2   No current facility-administered medications for this visit.     Musculoskeletal: Strength & Muscle Tone: within normal limits Gait & Station: normal Patient leans:  N/A  Psychiatric Specialty Exam: Review of Systems  Constitutional:  Positive for fatigue.  Musculoskeletal:  Positive for arthralgias and myalgias.  All other systems reviewed and are negative.   There were no vitals taken for this visit.There is no height or weight on file to calculate BMI.  General Appearance: Casual, Neat, and Well Groomed  Eye Contact:  Good  Speech:  Clear and Coherent  Volume:  Normal  Mood:  Euthymic  Affect:  Congruent  Thought Process:  Goal Directed  Orientation:  Full (Time, Place, and Person)  Thought Content: WDL   Suicidal Thoughts:  No  Homicidal Thoughts:  No  Memory:  Immediate;   Good Recent;   Good Remote;   Good  Judgement:  Good  Insight:  Good  Psychomotor Activity:  Decreased  Concentration:  Concentration: Good and Attention Span: Good  Recall:  Good  Fund of Knowledge: Good  Language: Good  Akathisia:  No  Handed:  Right  AIMS (if indicated): not done  Assets:  Communication Skills Desire for Improvement Resilience Social Support Talents/Skills  ADL's:  Intact  Cognition: WNL  Sleep:  Good   Screenings: GAD-7    Flowsheet Row Office Visit from 09/03/2022 in [facility]  Total GAD-7 Score 1      PHQ2-9    Flowsheet Row Office Visit from 09/03/2022 in [facility] Video Visit from 12/21/2021 in [facility] [place] Video Visit from 09/23/2021 in [facility] at [place] Video Visit from 07/09/2021 in [facility] at [place] Video Visit from 03/12/2021 in [facility] Total Score 0 2 0 4 0  PHQ-9 Total Score 4 4 -- 8 --      Flowsheet Row Video Visit from 12/21/2021 in [facility] at [place] Video Visit from 09/23/2021 in [facility] at [place] Video Visit  from 07/09/2021 in [facility] at [place]  C-SSRS RISK CATEGORY No Risk No Risk No Risk        Assessment and Plan: This patient is a 27 year old male with a history of bipolar disorder and ADHD.  He continues to do well despite his chronic fatigue or long COVID symptoms times.  He will continue Vyvanse 70 mg every morning for ADHD, Wellbutrin XL 300 mg daily for depression, hydroxyzine 100 mg 3 times daily as needed for anxiety and trazodone 100 mg at bedtime for sleep.  He will return to see me in 3 months  Collaboration of Care: Collaboration of Care: Primary Care Provider AEB notes are shared with PCP on the epic system  Patient/Guardian was advised Release of Information must be obtained prior to any record release in order to collaborate their care with an outside provider. Patient/Guardian was advised if they have not already done so to contact the registration department to sign all necessary forms in order for [place] to release information regarding their care.   Consent: Patient/Guardian gives verbal consent for treatment and assignment of benefits for services provided during this visit. Patient/Guardian expressed understanding and agreed to proceed.    Diannia Ruder, MD 10/18/2022, 3:31 PM

## 2023-02-15 ENCOUNTER — Telehealth (HOSPITAL_COMMUNITY): Payer: BC Managed Care – PPO | Admitting: Psychiatry

## 2023-02-15 ENCOUNTER — Encounter (HOSPITAL_COMMUNITY): Payer: Self-pay | Admitting: Psychiatry

## 2023-02-15 DIAGNOSIS — F3162 Bipolar disorder, current episode mixed, moderate: Secondary | ICD-10-CM | POA: Diagnosis not present

## 2023-02-15 DIAGNOSIS — F900 Attention-deficit hyperactivity disorder, predominantly inattentive type: Secondary | ICD-10-CM

## 2023-02-15 MED ORDER — TRAZODONE HCL 100 MG PO TABS: 100.0000 mg | ORAL_TABLET | Freq: Every day | ORAL | 2 refills | Status: DC

## 2023-02-15 MED ORDER — BUPROPION HCL ER (XL) 300 MG PO TB24: 300.0000 mg | ORAL_TABLET | ORAL | 2 refills | Status: DC

## 2023-02-15 MED ORDER — HYDROXYZINE PAMOATE 100 MG PO CAPS: 100.0000 mg | ORAL_CAPSULE | Freq: Three times a day (TID) | ORAL | 2 refills | Status: DC | PRN

## 2023-02-15 MED ORDER — LISDEXAMFETAMINE DIMESYLATE 70 MG PO CAPS: 70.0000 mg | ORAL_CAPSULE | Freq: Every day | ORAL | 0 refills | Status: DC

## 2023-02-15 NOTE — Progress Notes (Signed)
Virtual Visit via Video Note  I connected with [name] on 02/15/23 at  9:40 AM EDT by a video enabled telemedicine application and verified that I am speaking with the correct person using two identifiers.  Location: Patient: home Provider: office   I discussed the limitations of evaluation and management by telemedicine and the availability of in person appointments. The patient expressed understanding and agreed to proceed.     I discussed the assessment and treatment plan with the patient. The patient was provided an opportunity to ask questions and all were answered. The patient agreed with the plan and demonstrated an understanding of the instructions.   The patient was advised to call back or seek an in-person evaluation if the symptoms worsen or if the condition fails to improve as anticipated.  I provided 20 minutes of non-face-to-face time during this encounter.   Diannia Ruder, MD  [facility] MD/PA/NP OP Progress Note  02/15/2023 10:07 AM JOHNEY PEROTTI  MRN:  [id number]  Chief Complaint:  Chief Complaint  Patient presents with   ADHD   Depression   Anxiety   Follow-up   HPI: This patient is a 27 year old married white male who lives with his wife in [place].  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  Overall he is doing really well.  Last time he stated he was having some symptoms of long COVID such as muscle aches and fatigue but these have abated.  His focus is also improved.  He is eating and sleeping well, his energy is good.  He denies significant depression anxiety thoughts of suicide or self-harm.  He feels like his current regimen is working well. Visit Diagnosis:    ICD-10-CM   1. Attention deficit hyperactivity disorder (ADHD), predominantly inattentive type  F90.0     2. Bipolar 1 disorder, mixed, moderate (HCC)  F31.62       Past  Psychiatric History: Long-term outpatient treatment  Past Medical History:  Past Medical History:  Diagnosis Date   Allergy    seasonal   Anxiety    Bipolar affective disorder (HCC)    Depression    GERD (gastroesophageal reflux disease)    Seizures (HCC)    seizures began in NOV 2013 ,last was 2014    Past Surgical History:  Procedure Laterality Date   TONSILLECTOMY      Family Psychiatric History: See below  Family History:  Family History  Problem Relation Age of Onset   ADD / ADHD Father    Mood Disorder Father    ADD / ADHD Sister    Mood Disorder Sister    Bipolar disorder Sister    ADD / ADHD Brother    Mood Disorder Brother    Bipolar disorder Brother    Depression Maternal Grandfather    Mood Disorder Paternal Grandfather    Mood Disorder Paternal Grandmother    ADD / ADHD Sister    Mood Disorder Sister    Bipolar disorder Sister    ADD / ADHD Brother    Mood Disorder Brother    Bipolar disorder Brother     Social History:  Social History   Socioeconomic History   Marital status: Married    Spouse name: Not on file   Number of children: Not on file   Years of education: Not on file   Highest education level: Not on file  Occupational History   Not on file  Tobacco Use   Smoking status: Never   Smokeless tobacco: Never  Vaping Use   Vaping status: Never Used  Substance and Sexual Activity   Alcohol use: No    Alcohol/week: 0.0 standard drinks of alcohol    Comment: 12-17-2015 per pt no    Drug use: No    Comment: 12-17-15 per pt no    Sexual activity: Never  Other Topics Concern   Not on file  Social History Narrative   Married   Employed full-time   No alcohol tobacco or drug use   Social Determinants of Corporate investment banker Strain: Not on file  Food Insecurity: Not on file  Transportation Needs: Not on file  Physical Activity: Not on file  Stress: Not on file  Social Connections: Not on file    Allergies:  Allergies   Allergen Reactions   Sulfa Antibiotics Rash    Metabolic Disorder Labs: Lab Results  Component Value Date   HGBA1C 5.3 12/25/2018   No results found for: "PROLACTIN" No results found for: "CHOL", "TRIG", "HDL", "CHOLHDL", "VLDL", "LDLCALC" Lab Results  Component Value Date   TSH 1.610 02/06/2019   TSH 1.490 12/25/2018    Therapeutic Level Labs: No results found for: "LITHIUM" Lab Results  Component Value Date   VALPROATE 106 (H) 05/18/2013   VALPROATE 64.9 03/10/2012   No results found for: "CBMZ"  Current Medications: Current Outpatient Medications  Medication Sig Dispense Refill   buPROPion (WELLBUTRIN XL) 300 MG 24 hr tablet Take 1 tablet (300 mg total) by mouth every morning. 30 tablet 2   Cholecalciferol (VITAMIN D3) 125 MCG (5000 UT) CAPS Take 1 capsule (5,000 Units total) by mouth daily. 100 capsule 0   hydrOXYzine (VISTARIL) 100 MG capsule Take 1 capsule (100 mg total) by mouth 3 (three) times daily as needed. 90 capsule 2   lisdexamfetamine (VYVANSE) 70 MG capsule Take 1 capsule (70 mg total) by mouth daily. 30 capsule 0   lisdexamfetamine (VYVANSE) 70 MG capsule Take 1 capsule (70 mg total) by mouth daily. 30 capsule 0   lisdexamfetamine (VYVANSE) 70 MG capsule Take 1 capsule (70 mg total) by mouth daily. 30 capsule 0   meloxicam (MOBIC) 15 MG tablet Take 1 tablet (15 mg total) by mouth daily. 90 tablet 3   pantoprazole (PROTONIX) 40 MG tablet Take 1 tablet (40 mg total) by mouth 2 (two) times daily. 180 tablet 3   traZODone (DESYREL) 100 MG tablet Take 1 tablet (100 mg total) by mouth at bedtime. 30 tablet 2   No current facility-administered medications for this visit.     Musculoskeletal: Strength & Muscle Tone: within normal limits Gait & Station: normal Patient leans: N/A  Psychiatric Specialty Exam: Review of Systems  All other systems reviewed and are negative.   There were no vitals taken for this visit.There is no height or weight on file to  calculate BMI.  General Appearance: Casual and Fairly Groomed  Eye Contact:  Good  Speech:  Clear and Coherent  Volume:  Normal  Mood:  Euthymic  Affect:  Congruent  Thought Process:  Goal Directed  Orientation:  Full (Time, Place, and Person)  Thought Content: WDL   Suicidal Thoughts:  No  Homicidal Thoughts:  No  Memory:  Immediate;   Good Recent;   Good Remote;   Good  Judgement:  Good  Insight:  Good  Psychomotor Activity:  Normal  Concentration:  Concentration:  Good and Attention Span: Good  Recall:  Good  Fund of Knowledge: Good  Language: Good  Akathisia:  No  Handed:  Right  AIMS (if indicated): not done  Assets:  Communication Skills Desire for Improvement Physical Health Resilience Social Support Talents/Skills  ADL's:  Intact  Cognition: WNL  Sleep:  Good   Screenings: GAD-7    Flowsheet Row Office Visit from 09/03/2022 in [facility]  Total GAD-7 Score 1      PHQ2-9    Flowsheet Row Office Visit from 09/03/2022 in [facility] Video Visit from 12/21/2021 in [facility] [place] Video Visit from 09/23/2021 in [facility] at [place] Video Visit from 07/09/2021 in [facility] at [place] Video Visit from 03/12/2021 in [facility] Total Score 0 2 0 4 0  PHQ-9 Total Score 4 4 -- 8 --      Flowsheet Row Video Visit from 12/21/2021 in [facility] at [place] Video Visit from 09/23/2021 in [facility] at [place] Video Visit from 07/09/2021 in [facility] at [place]  C-SSRS RISK CATEGORY No Risk No Risk No Risk        Assessment and Plan: This patient is a 27year-old male with a history of bipolar disorder and ADHD.  He continues to do well on his current  regimen.  He will continue Vyvanse 70 mg every morning for ADHD, Wellbutrin XL 1300 mg daily for depression, hydroxyzine 100 mg 3 times daily as needed for anxiety and trazodone 100 mg at bedtime for sleep.  He will return to see me in 3 months  Collaboration of Care: Collaboration of Care: Primary Care Provider AEB notes are shared with PCP on the epic system  Patient/Guardian was advised Release of Information must be obtained prior to any record release in order to collaborate their care with an outside provider. Patient/Guardian was advised if they have not already done so to contact the registration department to sign all necessary forms in order for [place] to release information regarding their care.   Consent: Patient/Guardian gives verbal consent for treatment and assignment of benefits for services provided during this visit. Patient/Guardian expressed understanding and agreed to proceed.    Diannia Ruder, MD 02/15/2023, 10:07 AM

## 2023-05-16 ENCOUNTER — Telehealth (HOSPITAL_COMMUNITY): Payer: BC Managed Care – PPO | Admitting: Psychiatry

## 2023-05-16 ENCOUNTER — Encounter (HOSPITAL_COMMUNITY): Payer: Self-pay | Admitting: Psychiatry

## 2023-05-16 DIAGNOSIS — F3162 Bipolar disorder, current episode mixed, moderate: Secondary | ICD-10-CM

## 2023-05-16 DIAGNOSIS — F900 Attention-deficit hyperactivity disorder, predominantly inattentive type: Secondary | ICD-10-CM

## 2023-05-16 MED ORDER — TRAZODONE HCL 100 MG PO TABS: 100.0000 mg | ORAL_TABLET | Freq: Every day | ORAL | 2 refills | Status: DC

## 2023-05-16 MED ORDER — LISDEXAMFETAMINE DIMESYLATE 70 MG PO CAPS: 70.0000 mg | ORAL_CAPSULE | Freq: Every day | ORAL | 0 refills | Status: DC

## 2023-05-16 MED ORDER — BUPROPION HCL ER (XL) 300 MG PO TB24: 300.0000 mg | ORAL_TABLET | ORAL | 2 refills | Status: DC

## 2023-05-16 MED ORDER — HYDROXYZINE PAMOATE 100 MG PO CAPS: 100.0000 mg | ORAL_CAPSULE | Freq: Every day | ORAL | 2 refills | Status: DC | PRN

## 2023-05-16 NOTE — Progress Notes (Signed)
 Virtual Visit via Video Note  I connected with Ryan [name] on 05/16/23 at  1:20 PM EST by a video enabled telemedicine application and verified that I am speaking with the correct person using two identifiers.  Location: Patient: home Provider: office   I discussed the limitations of evaluation and management by telemedicine and the availability of in person appointments. The patient expressed understanding and agreed to proceed.    I discussed the assessment and treatment plan with the patient. The patient was provided an opportunity to ask questions and all were answered. The patient agreed with the plan and demonstrated an understanding of the instructions.   The patient was advised to call back or seek an in-person evaluation if the symptoms worsen or if the condition fails to improve as anticipated.  I provided 20 minutes of non-face-to-face time during this encounter.   [name], MD  [facility] MD/PA/NP OP Progress Note  05/16/2023 1:26 PM Ryan [name]  MRN:  [id number]  Chief Complaint:  Chief Complaint  Patient presents with   Depression   Anxiety   Follow-up   ADHD   HPI: This patient is a 28 year old married white male who lives with his wife in [place].  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  Overall he continues to do well.  He is focusing well with the Vyvanse  although with his new insurance it is costing quite a bit.  We may need to change it and he is going to let me know.  He is sleeping well his energy is improved.  He is no longer having long COVID symptoms except for sometimes feeling very hot.  He denies significant depression anxiety thoughts of suicide or self-harm. Visit Diagnosis:    ICD-10-CM   1. Attention deficit hyperactivity disorder (ADHD), predominantly inattentive type  F90.0     2. Bipolar 1  disorder, mixed, moderate (HCC)  F31.62       Past Psychiatric History: Long-term outpatient treatment  Past Medical History:  Past Medical History:  Diagnosis Date   Allergy    seasonal   Anxiety    Bipolar affective disorder (HCC)    Depression    GERD (gastroesophageal reflux disease)    Seizures (HCC)    seizures began in NOV 2013 ,last was 2014    Past Surgical History:  Procedure Laterality Date   TONSILLECTOMY      Family Psychiatric History: See below  Family History:  Family History  Problem Relation Age of Onset   ADD / ADHD Father    Mood Disorder Father    ADD / ADHD Sister    Mood Disorder Sister    Bipolar disorder Sister    ADD / ADHD Brother    Mood Disorder Brother    Bipolar disorder Brother    Depression Maternal Grandfather    Mood Disorder Paternal Grandfather    Mood Disorder Paternal Grandmother    ADD / ADHD Sister    Mood Disorder Sister    Bipolar disorder Sister    ADD / ADHD Brother    Mood Disorder Brother    Bipolar disorder Brother     Social History:  Social History   Socioeconomic History   Marital status: Married    Spouse name: Not on file   Number of children: Not on file   Years of education:  Not on file   Highest education level: Not on file  Occupational History   Not on file  Tobacco Use   Smoking status: Never   Smokeless tobacco: Never  Vaping Use   Vaping status: Never Used  Substance and Sexual Activity   Alcohol use: No    Alcohol/week: 0.0 standard drinks of alcohol    Comment: 12-17-2015 per pt no    Drug use: No    Comment: 12-17-15 per pt no    Sexual activity: Never  Other Topics Concern   Not on file  Social History Narrative   Married   Employed full-time   No alcohol tobacco or drug use   Social Drivers of Corporate Investment Banker Strain: Not on file  Food Insecurity: Not on file  Transportation Needs: Not on file  Physical Activity: Not on file  Stress: Not on file  Social  Connections: Not on file    Allergies:  Allergies  Allergen Reactions   Sulfa Antibiotics Rash    Metabolic Disorder Labs: Lab Results  Component Value Date   HGBA1C 5.3 12/25/2018   No results found for: PROLACTIN No results found for: CHOL, TRIG, HDL, CHOLHDL, VLDL, LDLCALC Lab Results  Component Value Date   TSH 1.610 02/06/2019   TSH 1.490 12/25/2018    Therapeutic Level Labs: No results found for: LITHIUM Lab Results  Component Value Date   VALPROATE 106 (H) 05/18/2013   VALPROATE 64.9 03/10/2012   No results found for: CBMZ  Current Medications: Current Outpatient Medications  Medication Sig Dispense Refill   buPROPion  (WELLBUTRIN  XL) 300 MG 24 hr tablet Take 1 tablet (300 mg total) by mouth every morning. 30 tablet 2   Cholecalciferol (VITAMIN D3) 125 MCG (5000 UT) CAPS Take 1 capsule (5,000 Units total) by mouth daily. 100 capsule 0   hydrOXYzine  (VISTARIL ) 100 MG capsule Take 1 capsule (100 mg total) by mouth daily as needed. 30 capsule 2   lisdexamfetamine (VYVANSE ) 70 MG capsule Take 1 capsule (70 mg total) by mouth daily. 30 capsule 0   lisdexamfetamine (VYVANSE ) 70 MG capsule Take 1 capsule (70 mg total) by mouth daily. 30 capsule 0   lisdexamfetamine (VYVANSE ) 70 MG capsule Take 1 capsule (70 mg total) by mouth daily. 30 capsule 0   meloxicam  (MOBIC ) 15 MG tablet Take 1 tablet (15 mg total) by mouth daily. 90 tablet 3   pantoprazole  (PROTONIX ) 40 MG tablet Take 1 tablet (40 mg total) by mouth 2 (two) times daily. 180 tablet 3   traZODone  (DESYREL ) 100 MG tablet Take 1 tablet (100 mg total) by mouth at bedtime. 30 tablet 2   No current facility-administered medications for this visit.     Musculoskeletal: Strength & Muscle Tone: within normal limits Gait & Station: normal Patient leans: N/A  Psychiatric Specialty Exam: Review of Systems  All other systems reviewed and are negative.   There were no vitals taken for this  visit.There is no height or weight on file to calculate BMI.  General Appearance: Casual and Fairly Groomed  Eye Contact:  Good  Speech:  Clear and Coherent  Volume:  Normal  Mood:  Euthymic  Affect:  Congruent  Thought Process:  Goal Directed  Orientation:  Full (Time, Place, and Person)  Thought Content: WDL   Suicidal Thoughts:  No  Homicidal Thoughts:  No  Memory:  Immediate;   Good Recent;   Good Remote;   Good  Judgement:  Good  Insight:  Good  Psychomotor Activity:  Normal  Concentration:  Concentration: Good and Attention Span: Good  Recall:  Good  Fund of Knowledge: Good  Language: Good  Akathisia:  No  Handed:  Right  AIMS (if indicated): not done  Assets:  Communication Skills Desire for Improvement Physical Health Resilience Social Support Talents/Skills Vocational/Educational  ADL's:  Intact  Cognition: WNL  Sleep:  Good   Screenings: GAD-7    Flowsheet Row Office Visit from 09/03/2022 in [facility]  Total GAD-7 Score 1      PHQ2-9    Flowsheet Row Office Visit from 09/03/2022 in [facility] Video Visit from 12/21/2021 in [facility] [place] Video Visit from 09/23/2021 in [facility] at [place] Video Visit from 07/09/2021 in [facility] at [place] Video Visit from 03/12/2021 in [facility] at [facility] Total Score 0 2 0 4 0  PHQ-9 Total Score 4 4 -- 8 --      Flowsheet Row Video Visit from 12/21/2021 in [facility] at [place] Video Visit from 09/23/2021 in [facility] at [place] Video Visit from 07/09/2021 in [facility]patient [facility] at [place]  C-SSRS RISK CATEGORY No Risk No Risk No Risk        Assessment and Plan: This patient is a 28 year old male with a history of  bipolar disorder and ADHD.  He continues to do well on his current regimen although he is only taking the hydroxyzine  100 mg once daily for anxiety.  He will continue this as well as Vyvanse  70 mg every morning for ADHD, Wellbutrin  XL 300 mg daily for depression and trazodone  100 mg at bedtime for sleep.  He will return to see me in 3 months  Collaboration of Care: Collaboration of Care: Primary Care Provider AEB notes are shared with PCP on the epic system  Patient/Guardian was advised Release of Information must be obtained prior to any record release in order to collaborate their care with an outside provider. Patient/Guardian was advised if they have not already done so to contact the registration department to sign all necessary forms in order for us  to release information regarding their care.   Consent: Patient/Guardian gives verbal consent for treatment and assignment of benefits for services provided during this visit. Patient/Guardian expressed understanding and agreed to proceed.    [name], MD 05/16/2023, 1:26 PM

## 2023-07-23 ENCOUNTER — Other Ambulatory Visit: Payer: Self-pay | Admitting: Family Medicine

## 2023-07-23 DIAGNOSIS — K047 Periapical abscess without sinus: Secondary | ICD-10-CM

## 2023-07-23 MED ORDER — AMOXICILLIN-POT CLAVULANATE 875-125 MG PO TABS: 1.0000 | ORAL_TABLET | Freq: Two times a day (BID) | ORAL | 0 refills | Status: DC

## 2023-07-23 NOTE — Progress Notes (Signed)
 Sent antibiotic for dental infection

## 2023-07-25 ENCOUNTER — Ambulatory Visit: Admitting: Family

## 2023-07-26 ENCOUNTER — Encounter: Payer: Self-pay | Admitting: Family Medicine

## 2023-08-15 ENCOUNTER — Telehealth (HOSPITAL_COMMUNITY): Payer: BC Managed Care – PPO | Admitting: Psychiatry

## 2023-08-15 ENCOUNTER — Encounter (HOSPITAL_COMMUNITY): Payer: Self-pay | Admitting: Psychiatry

## 2023-08-15 DIAGNOSIS — F900 Attention-deficit hyperactivity disorder, predominantly inattentive type: Secondary | ICD-10-CM | POA: Diagnosis not present

## 2023-08-15 DIAGNOSIS — F3162 Bipolar disorder, current episode mixed, moderate: Secondary | ICD-10-CM

## 2023-08-15 MED ORDER — TRAZODONE HCL 100 MG PO TABS: 100.0000 mg | ORAL_TABLET | Freq: Every day | ORAL | 2 refills | Status: DC

## 2023-08-15 MED ORDER — GABAPENTIN 100 MG PO CAPS: 100.0000 mg | ORAL_CAPSULE | Freq: Every day | ORAL | 2 refills | Status: DC

## 2023-08-15 MED ORDER — BUPROPION HCL ER (XL) 300 MG PO TB24: 300.0000 mg | ORAL_TABLET | ORAL | 2 refills | Status: DC

## 2023-08-15 MED ORDER — LISDEXAMFETAMINE DIMESYLATE 70 MG PO CAPS: 70.0000 mg | ORAL_CAPSULE | Freq: Every day | ORAL | 0 refills | Status: DC

## 2023-08-15 MED ORDER — BUSPIRONE HCL 10 MG PO TABS
10.0000 mg | ORAL_TABLET | Freq: Two times a day (BID) | ORAL | 2 refills | Status: DC
Start: 2023-08-15 — End: 2023-10-10

## 2023-08-15 NOTE — Progress Notes (Signed)
 Virtual Visit via Video Note  I connected with Ryan [name] on 08/15/23 at  3:20 PM EDT by a video enabled telemedicine application and verified that I am speaking with the correct person using two identifiers.  Location: Patient: work  Provider: office   I discussed the limitations of evaluation and management by telemedicine and the availability of in person appointments. The patient expressed understanding and agreed to proceed.     I discussed the assessment and treatment plan with the patient. The patient was provided an opportunity to ask questions and all were answered. The patient agreed with the plan and demonstrated an understanding of the instructions.   The patient was advised to call back or seek an in-person evaluation if the symptoms worsen or if the condition fails to improve as anticipated.  I provided 20 minutes of non-face-to-face time during this encounter.   Diannia Ruder, MD  [facility] MD/PA/NP OP Progress Note  08/15/2023 3:35 PM Ryan [name]  MRN:  [id number]  Chief Complaint:  Chief Complaint  Patient presents with   Depression   Anxiety   ADD   Follow-up   HPI:  This patient is a 28 year old married white male who lives with his wife in [place].  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  Overall he is doing well but lately he has been having some burning and tingling in his upper and lower extremities.  He did have a long COVID symptoms for a while and these resolved but this seems to be one of the manifestations.  His PCP put him on meloxicam which is helping a little bit.  I suggested we go back on the gabapentin that he took in the past.  He stopped due to drowsiness and I suggest just taking a low-dose at night.  He also states that when he went to pick up his hydroxyzine for anxiety it caused $30 which seems  way too much for a generic medicine.  He still has some anxiety symptoms so we will try BuSpar instead.  He denies depression thoughts of self-harm or suicide and is doing well in focus.  He continues to enjoy his work. Visit Diagnosis:    ICD-10-CM   1. Attention deficit hyperactivity disorder (ADHD), predominantly inattentive type  F90.0     2. Bipolar 1 disorder, mixed, moderate (HCC)  F31.62       Past Psychiatric History: Long-term outpatient treatment  Past Medical History:  Past Medical History:  Diagnosis Date   Allergy    seasonal   Anxiety    Bipolar affective disorder (HCC)    Depression    GERD (gastroesophageal reflux disease)    Seizures (HCC)    seizures began in NOV 2013 ,last was 2014    Past Surgical History:  Procedure Laterality Date   TONSILLECTOMY      Family Psychiatric History: See below  Family History:  Family History  Problem Relation Age of Onset   ADD / ADHD Father    Mood Disorder Father    ADD / ADHD Sister    Mood Disorder Sister    Bipolar disorder Sister    ADD / ADHD Brother    Mood Disorder Brother    Bipolar disorder Brother    Depression Maternal Grandfather    Mood Disorder Paternal Grandfather    Mood Disorder Paternal Grandmother  ADD / ADHD Sister    Mood Disorder Sister    Bipolar disorder Sister    ADD / ADHD Brother    Mood Disorder Brother    Bipolar disorder Brother     Social History:  Social History   Socioeconomic History   Marital status: Married    Spouse name: Not on file   Number of children: Not on file   Years of education: Not on file   Highest education level: Not on file  Occupational History   Not on file  Tobacco Use   Smoking status: Never   Smokeless tobacco: Never  Vaping Use   Vaping status: Never Used  Substance and Sexual Activity   Alcohol use: No    Alcohol/week: 0.0 standard drinks of alcohol    Comment: 12-17-2015 per pt no    Drug use: No    Comment: 12-17-15 per pt no     Sexual activity: Never  Other Topics Concern   Not on file  Social History Narrative   Married   Employed full-time   No alcohol tobacco or drug use   Social Drivers of Corporate investment banker Strain: Not on file  Food Insecurity: Not on file  Transportation Needs: Not on file  Physical Activity: Not on file  Stress: Not on file  Social Connections: Not on file    Allergies:  Allergies  Allergen Reactions   Sulfa Antibiotics Rash    Metabolic Disorder Labs: Lab Results  Component Value Date   HGBA1C 5.3 12/25/2018   No results found for: "PROLACTIN" No results found for: "CHOL", "TRIG", "HDL", "CHOLHDL", "VLDL", "LDLCALC" Lab Results  Component Value Date   TSH 1.610 02/06/2019   TSH 1.490 12/25/2018    Therapeutic Level Labs: No results found for: "LITHIUM" Lab Results  Component Value Date   VALPROATE 106 (H) 05/18/2013   VALPROATE 64.9 03/10/2012   No results found for: "CBMZ"  Current Medications: Current Outpatient Medications  Medication Sig Dispense Refill   busPIRone (BUSPAR) 10 MG tablet Take 1 tablet (10 mg total) by mouth 2 (two) times daily. 60 tablet 2   gabapentin (NEURONTIN) 100 MG capsule Take 1 capsule (100 mg total) by mouth at bedtime. 30 capsule 2   amoxicillin-clavulanate (AUGMENTIN) 875-125 MG tablet Take 1 tablet by mouth 2 (two) times daily. 20 tablet 0   buPROPion (WELLBUTRIN XL) 300 MG 24 hr tablet Take 1 tablet (300 mg total) by mouth every morning. 30 tablet 2   Cholecalciferol (VITAMIN D3) 125 MCG (5000 UT) CAPS Take 1 capsule (5,000 Units total) by mouth daily. 100 capsule 0   lisdexamfetamine (VYVANSE) 70 MG capsule Take 1 capsule (70 mg total) by mouth daily. 30 capsule 0   lisdexamfetamine (VYVANSE) 70 MG capsule Take 1 capsule (70 mg total) by mouth daily. 30 capsule 0   lisdexamfetamine (VYVANSE) 70 MG capsule Take 1 capsule (70 mg total) by mouth daily. 30 capsule 0   meloxicam (MOBIC) 15 MG tablet Take 1 tablet (15 mg  total) by mouth daily. 90 tablet 3   pantoprazole (PROTONIX) 40 MG tablet Take 1 tablet (40 mg total) by mouth 2 (two) times daily. 180 tablet 3   traZODone (DESYREL) 100 MG tablet Take 1 tablet (100 mg total) by mouth at bedtime. 30 tablet 2   No current facility-administered medications for this visit.     Musculoskeletal: Strength & Muscle Tone: within normal limits Gait & Station: normal Patient leans: N/A  Psychiatric Specialty  Exam: Review of Systems  Neurological:  Positive for numbness.  Psychiatric/Behavioral:  The patient is nervous/anxious.   All other systems reviewed and are negative.   There were no vitals taken for this visit.There is no height or weight on file to calculate BMI.  General Appearance: Casual and Fairly Groomed  Eye Contact:  Good  Speech:  Clear and Coherent  Volume:  Normal  Mood:  Euthymic slightly anxious  Affect:  Congruent  Thought Process:  Goal Directed  Orientation:  Full (Time, Place, and Person)  Thought Content: WDL   Suicidal Thoughts:  No  Homicidal Thoughts:  No  Memory:  Immediate;   Good Recent;   Good Remote;   Fair  Judgement:  Good  Insight:  Good  Psychomotor Activity:  Normal  Concentration:  Concentration: Good and Attention Span: Good  Recall:  Good  Fund of Knowledge: Good  Language: Good  Akathisia:  No  Handed:  Right  AIMS (if indicated): not done  Assets:  Communication Skills Desire for Improvement Physical Health Resilience Social Support Talents/Skills  ADL's:  Intact  Cognition: WNL  Sleep:  Good   Screenings: GAD-7    Flowsheet Row Office Visit from 09/03/2022 in [facility]  Total GAD-7 Score 1      PHQ2-9    Flowsheet Row Office Visit from 09/03/2022 in [facility] Video Visit from 12/21/2021 in [facility] [place] Video Visit from 09/23/2021 in [facility] at  [place] Video Visit from 07/09/2021 in [facility]patient [facility] at [place] Video Visit from 03/12/2021 in [facility] at [facility] Total Score 0 2 0 4 0  PHQ-9 Total Score 4 4 -- 8 --      Flowsheet Row Video Visit from 12/21/2021 in [facility] at [place] Video Visit from 09/23/2021 in [facility] at [place] Video Visit from 07/09/2021 in [facility]patient [facility] at [place]  C-SSRS RISK CATEGORY No Risk No Risk No Risk        Assessment and Plan: This patient is a 28 year old male with a history of bipolar disorder and ADHD.  Since he wants to try something else rather than hydroxyzine we will try BuSpar 10 mg twice daily for anxiety.  Will also add gabapentin 100 mg at bedtime for numbness and tingling.  He will continue Vyvanse 70 mg every morning for ADHD, Wellbutrin XL 300 mg daily for depression and trazodone 100 mg at bedtime for sleep.  He will return to see me in 3 months  Collaboration of Care: Collaboration of Care: Primary Care Provider AEB notes are shared with PCP on the epic system  Patient/Guardian was advised Release of Information must be obtained prior to any record release in order to collaborate their care with an outside provider. Patient/Guardian was advised if they have not already done so to contact the registration department to sign all necessary forms in order for [place] to release information regarding their care.   Consent: Patient/Guardian gives verbal consent for treatment and assignment of benefits for services provided during this visit. Patient/Guardian expressed understanding and agreed to proceed.    Diannia Ruder, MD 08/15/2023, 3:35 PM

## 2023-09-01 ENCOUNTER — Other Ambulatory Visit: Payer: Self-pay | Admitting: Family Medicine

## 2023-09-01 ENCOUNTER — Telehealth: Payer: Self-pay | Admitting: Family Medicine

## 2023-09-01 DIAGNOSIS — M2550 Pain in unspecified joint: Secondary | ICD-10-CM

## 2023-09-01 DIAGNOSIS — R5383 Other fatigue: Secondary | ICD-10-CM

## 2023-09-01 NOTE — Telephone Encounter (Signed)
 [name] NTBS last OV 09/03/22 NO RF sent to pharmacy last OV greater than a year

## 2023-09-01 NOTE — Telephone Encounter (Signed)
 Pt asking if apt for this med refill can be done virtual. I explained a message will be sent to DR [name] to approve. Is it ok to schedule virtual?

## 2023-09-15 ENCOUNTER — Other Ambulatory Visit: Payer: Self-pay | Admitting: Family Medicine

## 2023-09-15 DIAGNOSIS — M2550 Pain in unspecified joint: Secondary | ICD-10-CM

## 2023-09-15 DIAGNOSIS — R5383 Other fatigue: Secondary | ICD-10-CM

## 2023-09-15 NOTE — Telephone Encounter (Signed)
 [name] needs to be seen, last ov 09/03/2022  No refill sent to pharmacy last ov greater than a year.

## 2023-09-15 NOTE — Telephone Encounter (Signed)
 Scheduled appt.

## 2023-09-16 ENCOUNTER — Ambulatory Visit: Admitting: Family Medicine

## 2023-10-10 ENCOUNTER — Encounter (HOSPITAL_COMMUNITY): Payer: Self-pay | Admitting: Psychiatry

## 2023-10-10 ENCOUNTER — Telehealth (INDEPENDENT_AMBULATORY_CARE_PROVIDER_SITE_OTHER): Admitting: Psychiatry

## 2023-10-10 DIAGNOSIS — F900 Attention-deficit hyperactivity disorder, predominantly inattentive type: Secondary | ICD-10-CM

## 2023-10-10 DIAGNOSIS — F3162 Bipolar disorder, current episode mixed, moderate: Secondary | ICD-10-CM

## 2023-10-10 MED ORDER — FLUOXETINE HCL 20 MG PO CAPS: 20.0000 mg | ORAL_CAPSULE | ORAL | 2 refills | Status: DC

## 2023-10-10 MED ORDER — BUPROPION HCL ER (XL) 300 MG PO TB24: 300.0000 mg | ORAL_TABLET | ORAL | 2 refills | Status: DC

## 2023-10-10 MED ORDER — LISDEXAMFETAMINE DIMESYLATE 70 MG PO CAPS: 70.0000 mg | ORAL_CAPSULE | Freq: Every day | ORAL | 0 refills | Status: DC

## 2023-10-10 MED ORDER — GABAPENTIN 100 MG PO CAPS: 100.0000 mg | ORAL_CAPSULE | Freq: Every day | ORAL | 2 refills | Status: DC

## 2023-10-10 NOTE — Progress Notes (Signed)
 Virtual Visit via Video Note  I connected with Ryan [name] on 10/10/23 at 10:20 AM EDT by a video enabled telemedicine application and verified that I am speaking with the correct person using two identifiers.  Location: Patient: home Provider: office   I discussed the limitations of evaluation and management by telemedicine and the availability of in person appointments. The patient expressed understanding and agreed to proceed.     I discussed the assessment and treatment plan with the patient. The patient was provided an opportunity to ask questions and all were answered. The patient agreed with the plan and demonstrated an understanding of the instructions.   The patient was advised to call back or seek an in-person evaluation if the symptoms worsen or if the condition fails to improve as anticipated.  I provided 20 minutes of non-face-to-face time during this encounter.   Alfredia Annas, MD  [facility] MD/PA/NP OP Progress Note  10/10/2023 10:28 AM Ryan [name]  MRN:  [id number]  Chief Complaint:  Chief Complaint  Patient presents with   Anxiety   Depression   ADHD   Follow-up   HPI: This patient is a 28 year old married white male lives with his wife in [place].  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  The patient returns for follow-up as a work in today after 2 months regarding his depression anxiety presumed bipolar disorder and ADD.  He states recently he has been more depressed.  He just feels empty and sad.  He denies changes in appetite energy sleep.  He denies thoughts of suicide or self-harm.  Last time we started BuSpar  in the depression started about 4 weeks after that so I suggested we stop it.  He is already on Wellbutrin  but we can add an SSRI and he has never tried Prozac.  He is feeling very sensitive and cries easily and this might be helpful.  He is trying to stay around people and not be socially isolated. Visit Diagnosis:     ICD-10-CM   1. Attention deficit hyperactivity disorder (ADHD), predominantly inattentive type  F90.0     2. Bipolar 1 disorder, mixed, moderate (HCC)  F31.62       Past Psychiatric History: Long term outpatient treatment  Past Medical History:  Past Medical History:  Diagnosis Date   Allergy    seasonal   Anxiety    Bipolar affective disorder (HCC)    Depression    GERD (gastroesophageal reflux disease)    Seizures (HCC)    seizures began in NOV 2013 ,last was 2014    Past Surgical History:  Procedure Laterality Date   TONSILLECTOMY      Family Psychiatric History: See below  Family History:  Family History  Problem Relation Age of Onset   ADD / ADHD Father    Mood Disorder Father    ADD / ADHD Sister    Mood Disorder Sister    Bipolar disorder Sister    ADD / ADHD Brother    Mood Disorder Brother    Bipolar disorder Brother    Depression Maternal Grandfather    Mood Disorder Paternal Grandfather    Mood Disorder Paternal Grandmother    ADD / ADHD Sister    Mood Disorder Sister    Bipolar disorder Sister    ADD / ADHD Brother    Mood Disorder Brother    Bipolar disorder Brother     Social History:  Social History   Socioeconomic History  Marital status: Married    Spouse name: Not on file   Number of children: Not on file   Years of education: Not on file   Highest education level: Not on file  Occupational History   Not on file  Tobacco Use   Smoking status: Never   Smokeless tobacco: Never  Vaping Use   Vaping status: Never Used  Substance and Sexual Activity   Alcohol use: No    Alcohol/week: 0.0 standard drinks of alcohol    Comment: 12-17-2015 per pt no    Drug use: No    Comment: 12-17-15 per pt no    Sexual activity: Never  Other Topics Concern   Not on file  Social History Narrative   Married   Employed full-time   No alcohol tobacco or drug use   Social Drivers of Corporate investment banker Strain: Not on file  Food  Insecurity: Not on file  Transportation Needs: Not on file  Physical Activity: Not on file  Stress: Not on file  Social Connections: Not on file    Allergies:  Allergies  Allergen Reactions   Sulfa Antibiotics Rash    Metabolic Disorder Labs: Lab Results  Component Value Date   HGBA1C 5.3 12/25/2018   No results found for: "PROLACTIN" No results found for: "CHOL", "TRIG", "HDL", "CHOLHDL", "VLDL", "LDLCALC" Lab Results  Component Value Date   TSH 1.610 02/06/2019   TSH 1.490 12/25/2018    Therapeutic Level Labs: No results found for: "LITHIUM" Lab Results  Component Value Date   VALPROATE 106 (H) 05/18/2013   VALPROATE 64.9 03/10/2012   No results found for: "CBMZ"  Current Medications: Current Outpatient Medications  Medication Sig Dispense Refill   amoxicillin -clavulanate (AUGMENTIN ) 875-125 MG tablet Take 1 tablet by mouth 2 (two) times daily. 20 tablet 0   buPROPion  (WELLBUTRIN  XL) 300 MG 24 hr tablet Take 1 tablet (300 mg total) by mouth every morning. 30 tablet 2   Cholecalciferol (VITAMIN D3) 125 MCG (5000 UT) CAPS Take 1 capsule (5,000 Units total) by mouth daily. 100 capsule 0   FLUoxetine (PROZAC) 20 MG capsule Take 1 capsule (20 mg total) by mouth every morning. 30 capsule 2   gabapentin  (NEURONTIN ) 100 MG capsule Take 1 capsule (100 mg total) by mouth at bedtime. 30 capsule 2   lisdexamfetamine (VYVANSE ) 70 MG capsule Take 1 capsule (70 mg total) by mouth daily. 30 capsule 0   lisdexamfetamine (VYVANSE ) 70 MG capsule Take 1 capsule (70 mg total) by mouth daily. 30 capsule 0   lisdexamfetamine (VYVANSE ) 70 MG capsule Take 1 capsule (70 mg total) by mouth daily. 30 capsule 0   meloxicam  (MOBIC ) 15 MG tablet Take 1 tablet (15 mg total) by mouth daily. 90 tablet 3   pantoprazole  (PROTONIX ) 40 MG tablet Take 1 tablet (40 mg total) by mouth 2 (two) times daily. 180 tablet 3   traZODone  (DESYREL ) 100 MG tablet Take 1 tablet (100 mg total) by mouth at bedtime. 30  tablet 2   No current facility-administered medications for this visit.     Musculoskeletal: Strength & Muscle Tone: within normal limits Gait & Station: normal Patient leans: N/A  Psychiatric Specialty Exam: Review of Systems  Psychiatric/Behavioral:  Positive for dysphoric mood.   All other systems reviewed and are negative.   There were no vitals taken for this visit.There is no height or weight on file to calculate BMI.  General Appearance: Casual and Fairly Groomed  Eye Contact:  Good  Speech:  Clear and Coherent  Volume:  Normal  Mood:  Depressed  Affect:  Flat  Thought Process:  Goal Directed  Orientation:  Full (Time, Place, and Person)  Thought Content: Rumination   Suicidal Thoughts:  No  Homicidal Thoughts:  No  Memory:  Immediate;   Good Recent;   Good Remote;   Good  Judgement:  Good  Insight:  Good  Psychomotor Activity:  Normal  Concentration:  Concentration: Good and Attention Span: Good  Recall:  Good  Fund of Knowledge: Good  Language: Good  Akathisia:  No  Handed:  Right  AIMS (if indicated): not done  Assets:  Communication Skills Desire for Improvement Physical Health Resilience Social Support  ADL's:  Intact  Cognition: WNL  Sleep:  Good   Screenings: GAD-7    Flowsheet Row Office Visit from 09/03/2022 in [facility]  Total GAD-7 Score 1      PHQ2-9    Flowsheet Row Video Visit from 10/10/2023 in [facility] [place] Office Visit from 09/03/2022 in [facility] Video Visit from 12/21/2021 in [facility] [place] Video Visit from 09/23/2021 in [facility] at [place] Video Visit from 07/09/2021 in [facility] Total Score 6 0 2 0 4  PHQ-9 Total Score 7 4 4  -- 8      Flowsheet Row Video Visit from 10/10/2023 in [facility] at [place] Video Visit from 12/21/2021 in [facility] at [place] Video Visit from 09/23/2021 in [facility] at [place]  C-SSRS RISK CATEGORY No Risk No Risk No Risk        Assessment and Plan: This patient is a 28 year old male with a history of bipolar disorder and ADHD.  He has been feeling more depressed recently and it seems to coincide with the initiation of BuSpar  so we will stop this.  Will also add Prozac 20 mg daily to his regimen.  He will continue Wellbutrin  XL 300 mg daily also for depression, gabapentin  100 mg at bedtime for numbness and tingling, Vyvanse  70 mg every morning for ADHD and trazodone  100 mg at bedtime only as needed for sleep.  He will return to see me in 4 weeks or call sooner as needed  Collaboration of Care: Collaboration of Care: Primary Care Provider AEB notes are shared with PCP on the epic system  Patient/Guardian was advised Release of Information must be obtained prior to any record release in order to collaborate their care with an outside provider. Patient/Guardian was advised if they have not already done so to contact the registration department to sign all necessary forms in order for us  to release information regarding their care.   Consent: Patient/Guardian gives verbal consent for treatment and assignment of benefits for services provided during this visit. Patient/Guardian expressed understanding and agreed to proceed.    Alfredia Annas, MD 10/10/2023, 10:28 AM

## 2023-11-01 ENCOUNTER — Other Ambulatory Visit: Payer: Self-pay | Admitting: *Deleted

## 2023-11-01 NOTE — Telephone Encounter (Signed)
 Fax from [facility] [address] RE: Pantoprazole  prescriber Dr Avram declined writing send to PCP  Pt NTBS by PCP last OV 09/03/22 NO RF sen to pharmacy last OV greater than a year

## 2023-11-01 NOTE — Addendum Note (Signed)
 Addended by: Tamaira Ciriello D on: 11/01/2023 04:23 PM   Modules accepted: Orders

## 2023-11-01 NOTE — Telephone Encounter (Signed)
 Appt 8-11 with [name]

## 2023-11-02 MED ORDER — PANTOPRAZOLE SODIUM 40 MG PO TBEC: 40.0000 mg | DELAYED_RELEASE_TABLET | Freq: Two times a day (BID) | ORAL | 0 refills | Status: DC

## 2023-11-07 ENCOUNTER — Encounter (HOSPITAL_COMMUNITY): Payer: Self-pay | Admitting: Psychiatry

## 2023-11-07 ENCOUNTER — Telehealth (HOSPITAL_COMMUNITY): Admitting: Psychiatry

## 2023-11-07 DIAGNOSIS — F900 Attention-deficit hyperactivity disorder, predominantly inattentive type: Secondary | ICD-10-CM | POA: Diagnosis not present

## 2023-11-07 DIAGNOSIS — F3162 Bipolar disorder, current episode mixed, moderate: Secondary | ICD-10-CM | POA: Diagnosis not present

## 2023-11-07 MED ORDER — TRAZODONE HCL 100 MG PO TABS: 100.0000 mg | ORAL_TABLET | Freq: Every day | ORAL | 2 refills | Status: DC

## 2023-11-07 MED ORDER — BUPROPION HCL ER (XL) 300 MG PO TB24: 300.0000 mg | ORAL_TABLET | ORAL | 2 refills | Status: DC

## 2023-11-07 MED ORDER — LISDEXAMFETAMINE DIMESYLATE 70 MG PO CAPS: 70.0000 mg | ORAL_CAPSULE | Freq: Every day | ORAL | 0 refills | Status: DC

## 2023-11-07 MED ORDER — CLONAZEPAM 0.5 MG PO TABS: 0.5000 mg | ORAL_TABLET | Freq: Two times a day (BID) | ORAL | 2 refills | Status: DC | PRN

## 2023-11-07 MED ORDER — FLUOXETINE HCL 20 MG PO CAPS: 20.0000 mg | ORAL_CAPSULE | ORAL | 2 refills | Status: DC

## 2023-11-07 NOTE — Progress Notes (Signed)
 Virtual Visit via Video Note  I connected with Ryan [name] on 11/07/23 at  1:00 PM EDT by a video enabled telemedicine application and verified that I am speaking with the correct person using two identifiers.  Location: Patient: home Provider: office   I discussed the limitations of evaluation and management by telemedicine and the availability of in person appointments. The patient expressed understanding and agreed to proceed.    I discussed the assessment and treatment plan with the patient. The patient was provided an opportunity to ask questions and all were answered. The patient agreed with the plan and demonstrated an understanding of the instructions.   The patient was advised to call back or seek an in-person evaluation if the symptoms worsen or if the condition fails to improve as anticipated.  I provided 20 minutes of non-face-to-face time during this encounter.   [name], MD  [facility] MD/PA/NP OP Progress Note  11/07/2023 1:15 PM [name]  MRN:  [id number]  Chief Complaint:  Chief Complaint  Patient presents with   Depression   Anxiety   ADD   Follow-up   HPI: This patient is a 28 year old married white male lives with his wife in [place]. 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   The patient returns for follow-up after 4 weeks regarding his depression anxiety presumed bipolar disorder and ADD.  He told me last time he was more depressed.  He could not give any good reasons for the depression such as life changes or health concerns.  We did start Prozac  20 mg and he states the first week he had a lot of anxiety but he is slowly starting to feel better and have more energy.  He still having waves of anxiety here and there and would like something to take more immediately.  In the past the clonazepam  worked for him and I think we can reinstate it.  We talked about going up on the Prozac  but he would rather wait another 4 weeks as he  is thinks it is slowly starting to help his mood and energy.  He denies any thoughts of self-harm or suicide.  He is sleeping well with the trazodone  Visit Diagnosis:    ICD-10-CM   1. Attention deficit hyperactivity disorder (ADHD), predominantly inattentive type  F90.0     2. Bipolar 1 disorder, mixed, moderate (HCC)  F31.62       Past Psychiatric History: Long-term outpatient treatment  Past Medical History:  Past Medical History:  Diagnosis Date   Allergy    seasonal   Anxiety    Bipolar affective disorder (HCC)    Depression    GERD (gastroesophageal reflux disease)    Seizures (HCC)    seizures began in NOV 2013 ,last was 2014    Past Surgical History:  Procedure Laterality Date   TONSILLECTOMY      Family Psychiatric History: See below  Family History:  Family History  Problem Relation Age of Onset   ADD / ADHD Father    Mood Disorder Father    ADD / ADHD Sister    Mood Disorder Sister    Bipolar disorder Sister    ADD / ADHD Brother    Mood Disorder Brother    Bipolar disorder Brother    Depression Maternal Grandfather    Mood Disorder Paternal Grandfather    Mood Disorder Paternal Grandmother    ADD / ADHD Sister    Mood Disorder Sister  Bipolar disorder Sister    ADD / ADHD Brother    Mood Disorder Brother    Bipolar disorder Brother     Social History:  Social History   Socioeconomic History   Marital status: Married    Spouse name: Not on file   Number of children: Not on file   Years of education: Not on file   Highest education level: Not on file  Occupational History   Not on file  Tobacco Use   Smoking status: Never   Smokeless tobacco: Never  Vaping Use   Vaping status: Never Used  Substance and Sexual Activity   Alcohol use: No    Alcohol/week: 0.0 standard drinks of alcohol    Comment: 12-17-2015 per pt no    Drug use: No    Comment: 12-17-15 per pt no    Sexual activity: Never  Other Topics Concern   Not on file  Social  History Narrative   Married   Employed full-time   No alcohol tobacco or drug use   Social Drivers of Corporate investment banker Strain: Not on file  Food Insecurity: Not on file  Transportation Needs: Not on file  Physical Activity: Not on file  Stress: Not on file  Social Connections: Not on file    Allergies:  Allergies  Allergen Reactions   Sulfa Antibiotics Rash    Metabolic Disorder Labs: Lab Results  Component Value Date   HGBA1C 5.3 12/25/2018   No results found for: PROLACTIN No results found for: CHOL, TRIG, HDL, CHOLHDL, VLDL, LDLCALC Lab Results  Component Value Date   TSH 1.610 02/06/2019   TSH 1.490 12/25/2018    Therapeutic Level Labs: No results found for: LITHIUM Lab Results  Component Value Date   VALPROATE 106 (H) 05/18/2013   VALPROATE 64.9 03/10/2012   No results found for: CBMZ  Current Medications: Current Outpatient Medications  Medication Sig Dispense Refill   amoxicillin -clavulanate (AUGMENTIN ) 875-125 MG tablet Take 1 tablet by mouth 2 (two) times daily. 20 tablet 0   buPROPion  (WELLBUTRIN  XL) 300 MG 24 hr tablet Take 1 tablet (300 mg total) by mouth every morning. 30 tablet 2   Cholecalciferol (VITAMIN D3) 125 MCG (5000 UT) CAPS Take 1 capsule (5,000 Units total) by mouth daily. 100 capsule 0   clonazePAM  (KLONOPIN ) 0.5 MG tablet Take 1 tablet (0.5 mg total) by mouth 2 (two) times daily as needed for anxiety. 60 tablet 2   FLUoxetine  (PROZAC ) 20 MG capsule Take 1 capsule (20 mg total) by mouth every morning. 30 capsule 2   gabapentin  (NEURONTIN ) 100 MG capsule Take 1 capsule (100 mg total) by mouth at bedtime. 30 capsule 2   lisdexamfetamine (VYVANSE ) 70 MG capsule Take 1 capsule (70 mg total) by mouth daily. 30 capsule 0   lisdexamfetamine (VYVANSE ) 70 MG capsule Take 1 capsule (70 mg total) by mouth daily. 30 capsule 0   lisdexamfetamine (VYVANSE ) 70 MG capsule Take 1 capsule (70 mg total) by mouth daily. 30  capsule 0   meloxicam  (MOBIC ) 15 MG tablet Take 1 tablet (15 mg total) by mouth daily. 90 tablet 3   pantoprazole  (PROTONIX ) 40 MG tablet Take 1 tablet (40 mg total) by mouth 2 (two) times daily. 180 tablet 0   traZODone  (DESYREL ) 100 MG tablet Take 1 tablet (100 mg total) by mouth at bedtime. 30 tablet 2   No current facility-administered medications for this visit.     Musculoskeletal: Strength & Muscle Tone: within normal  limits Gait & Station: normal Patient leans: N/A  Psychiatric Specialty Exam: Review of Systems  Psychiatric/Behavioral:  Positive for dysphoric mood. The patient is nervous/anxious.   All other systems reviewed and are negative.   There were no vitals taken for this visit.There is no height or weight on file to calculate BMI.  General Appearance: Casual and Fairly Groomed  Eye Contact:  Good  Speech:  Clear and Coherent  Volume:  Normal  Mood:  Anxious and Dysphoric  Affect:  Congruent  Thought Process:  Goal Directed  Orientation:  Full (Time, Place, and Person)  Thought Content: Rumination   Suicidal Thoughts:  No  Homicidal Thoughts:  No  Memory:  Immediate;   Good Recent;   Good Remote;   Good  Judgement:  Good  Insight:  Good  Psychomotor Activity:  Normal  Concentration:  Concentration: Good and Attention Span: Good  Recall:  Good  Fund of Knowledge: Good  Language: Good  Akathisia:  No  Handed:  Right  AIMS (if indicated): not done  Assets:  Communication Skills Desire for Improvement Physical Health Resilience Social Support Vocational/Educational  ADL's:  Intact  Cognition: WNL  Sleep:  Fair   Screenings: GAD-7    Flowsheet Row Office Visit from 09/03/2022 in [facility]  Total GAD-7 Score 1   PHQ2-9    Flowsheet Row Video Visit from 10/10/2023 in [facility] [place] Office Visit from 09/03/2022 in [facility] Video Visit from  12/21/2021 in [facility] [place] Video Visit from 09/23/2021 in [facility] at [place] Video Visit from 07/09/2021 in [facility] Total Score 6 0 2 0 4  PHQ-9 Total Score 7 4 4  -- 8   Flowsheet Row Video Visit from 10/10/2023 in [facility] at [place] Video Visit from 12/21/2021 in [facility] at [place] Video Visit from 09/23/2021 in [facility] at [place]  C-SSRS RISK CATEGORY No Risk No Risk No Risk     Assessment and Plan: This patient is a 28 year old male with a history of bipolar disorder and ADHD.  He was feeling more depressed last visit and we started Prozac  20 mg in addition to the Wellbutrin  XL 300 mg for depression.  He feels like it is slowly starting to help so this will be continued as well as Vyvanse  70 mg every morning for ADD, trazodone  100 mg at bedtime as needed for sleep and gabapentin  100 mg at bedtime for numbness tingling and anxiety.  We will add clonazepam  0.5 mg up to twice daily for anxiety.  He will return to see me in 4 weeks  Collaboration of Care: Collaboration of Care: Primary Care Provider AEB notes that shared with PCP on the epic system  Patient/Guardian was advised Release of Information must be obtained prior to any record release in order to collaborate their care with an outside provider. Patient/Guardian was advised if they have not already done so to contact the registration department to sign all necessary forms in order for us  to release information regarding their care.   Consent: Patient/Guardian gives verbal consent for treatment and assignment of benefits for services provided during this visit. Patient/Guardian expressed understanding and agreed to proceed.    [name], MD 11/07/2023, 1:15 PM

## 2023-11-14 ENCOUNTER — Telehealth (HOSPITAL_COMMUNITY): Admitting: Psychiatry

## 2023-12-05 ENCOUNTER — Encounter (HOSPITAL_COMMUNITY): Payer: Self-pay | Admitting: Psychiatry

## 2023-12-05 ENCOUNTER — Telehealth (INDEPENDENT_AMBULATORY_CARE_PROVIDER_SITE_OTHER): Admitting: Psychiatry

## 2023-12-05 DIAGNOSIS — F900 Attention-deficit hyperactivity disorder, predominantly inattentive type: Secondary | ICD-10-CM | POA: Diagnosis not present

## 2023-12-05 DIAGNOSIS — F3162 Bipolar disorder, current episode mixed, moderate: Secondary | ICD-10-CM | POA: Diagnosis not present

## 2023-12-05 MED ORDER — CLONAZEPAM 0.5 MG PO TABS: 0.5000 mg | ORAL_TABLET | Freq: Two times a day (BID) | ORAL | 2 refills | Status: DC | PRN

## 2023-12-05 MED ORDER — BUPROPION HCL ER (XL) 300 MG PO TB24: 300.0000 mg | ORAL_TABLET | ORAL | 2 refills | Status: DC

## 2023-12-05 MED ORDER — FLUOXETINE HCL 40 MG PO CAPS: 40.0000 mg | ORAL_CAPSULE | Freq: Every day | ORAL | 2 refills | Status: DC

## 2023-12-05 MED ORDER — TRAZODONE HCL 50 MG PO TABS
50.0000 mg | ORAL_TABLET | Freq: Every day | ORAL | 2 refills | Status: DC
Start: 2023-12-05 — End: 2024-01-03

## 2023-12-05 MED ORDER — GABAPENTIN 100 MG PO CAPS: 100.0000 mg | ORAL_CAPSULE | Freq: Every day | ORAL | 2 refills | Status: DC

## 2023-12-05 MED ORDER — LISDEXAMFETAMINE DIMESYLATE 70 MG PO CAPS: 70.0000 mg | ORAL_CAPSULE | Freq: Every day | ORAL | 0 refills | Status: DC

## 2023-12-05 NOTE — Progress Notes (Signed)
 Virtual Visit via Video Note  I connected with [name] on 12/05/23 at  4:00 PM EDT by a video enabled telemedicine application and verified that I am speaking with the correct person using two identifiers.  Location: Patient: home Provider: office   I discussed the limitations of evaluation and management by telemedicine and the availability of in person appointments. The patient expressed understanding and agreed to proceed.     I discussed the assessment and treatment plan with the patient. The patient was provided an opportunity to ask questions and all were answered. The patient agreed with the plan and demonstrated an understanding of the instructions.   The patient was advised to call back or seek an in-person evaluation if the symptoms worsen or if the condition fails to improve as anticipated.  I provided 20 minutes of non-face-to-face time during this encounter.   [name], MD  [facility] MD/PA/NP OP Progress Note  12/05/2023 4:09 PM ROSSER COLLINGTON  MRN:  [id number]  Chief Complaint:  Chief Complaint  Patient presents with   Anxiety   Depression   Follow-up   HPI: This patient is a 28 year old married [place] male lives with his wife in [name]. 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  A couple of months ago he stated he was more depressed.  We did start Prozac  20 mg and he was feeling a little bit better.  I offered to increase it last time but he wanted to wait.  He states now that things have not improved and he is still quite depressed and having anxiety.  The clonazepam  is helping the anxiety.  He does relate that he is in therapy with an online therapist and he is figuring out a lot of things about my life he is not particularly happy in his marriage and they are staying up marital counselor.  However he thinks this is  separate from his feelings of depression.  I explained to him that this would be hard to separate.  Nevertheless it could be that the depression came first and is affecting his relationships.  The patient denies any thoughts of suicide or self-harm.  I still maintain that would be better to go up on the Prozac  before we give up on it so we will increase to 40 mg.  He states that the trazodone  is too strong so I will cut it back to 50 mg and he can even cut that in half. Visit Diagnosis:    ICD-10-CM   1. Attention deficit hyperactivity disorder (ADHD), predominantly inattentive type  F90.0     2. Bipolar 1 disorder, mixed, moderate (HCC)  F31.62       Past Psychiatric History: Long-term outpatient treatment  Past Medical History:  Past Medical History:  Diagnosis Date   Allergy    seasonal   Anxiety    Bipolar affective disorder (HCC)    Depression    GERD (gastroesophageal reflux disease)    Seizures (HCC)    seizures began in NOV 2013 ,last was 2014    Past Surgical History:  Procedure Laterality Date   TONSILLECTOMY      Family Psychiatric History: See below  Family History:  Family History  Problem Relation Age of Onset   ADD / ADHD Father    Mood Disorder Father    ADD / ADHD Sister  Mood Disorder Sister    Bipolar disorder Sister    ADD / ADHD Brother    Mood Disorder Brother    Bipolar disorder Brother    Depression Maternal Grandfather    Mood Disorder Paternal Grandfather    Mood Disorder Paternal Grandmother    ADD / ADHD Sister    Mood Disorder Sister    Bipolar disorder Sister    ADD / ADHD Brother    Mood Disorder Brother    Bipolar disorder Brother     Social History:  Social History   Socioeconomic History   Marital status: Married    Spouse name: Not on file   Number of children: Not on file   Years of education: Not on file   Highest education level: Not on file  Occupational History   Not on file  Tobacco Use   Smoking status:  Never   Smokeless tobacco: Never  Vaping Use   Vaping status: Never Used  Substance and Sexual Activity   Alcohol use: No    Alcohol/week: 0.0 standard drinks of alcohol    Comment: 12-17-2015 per pt no    Drug use: No    Comment: 12-17-15 per pt no    Sexual activity: Never  Other Topics Concern   Not on file  Social History Narrative   Married   Employed full-time   No alcohol tobacco or drug use   Social Drivers of Corporate investment banker Strain: Not on file  Food Insecurity: Not on file  Transportation Needs: Not on file  Physical Activity: Not on file  Stress: Not on file  Social Connections: Not on file    Allergies:  Allergies  Allergen Reactions   Sulfa Antibiotics Rash    Metabolic Disorder Labs: Lab Results  Component Value Date   HGBA1C 5.3 12/25/2018   No results found for: PROLACTIN No results found for: CHOL, TRIG, HDL, CHOLHDL, VLDL, LDLCALC Lab Results  Component Value Date   TSH 1.610 02/06/2019   TSH 1.490 12/25/2018    Therapeutic Level Labs: No results found for: LITHIUM Lab Results  Component Value Date   VALPROATE 106 (H) 05/18/2013   VALPROATE 64.9 03/10/2012   No results found for: CBMZ  Current Medications: Current Outpatient Medications  Medication Sig Dispense Refill   FLUoxetine  (PROZAC ) 40 MG capsule Take 1 capsule (40 mg total) by mouth daily. 30 capsule 2   traZODone  (DESYREL ) 50 MG tablet Take 1 tablet (50 mg total) by mouth at bedtime. 30 tablet 2   amoxicillin -clavulanate (AUGMENTIN ) 875-125 MG tablet Take 1 tablet by mouth 2 (two) times daily. 20 tablet 0   buPROPion  (WELLBUTRIN  XL) 300 MG 24 hr tablet Take 1 tablet (300 mg total) by mouth every morning. 30 tablet 2   Cholecalciferol (VITAMIN D3) 125 MCG (5000 UT) CAPS Take 1 capsule (5,000 Units total) by mouth daily. 100 capsule 0   clonazePAM  (KLONOPIN ) 0.5 MG tablet Take 1 tablet (0.5 mg total) by mouth 2 (two) times daily as needed for anxiety.  60 tablet 2   gabapentin  (NEURONTIN ) 100 MG capsule Take 1 capsule (100 mg total) by mouth at bedtime. 30 capsule 2   lisdexamfetamine (VYVANSE ) 70 MG capsule Take 1 capsule (70 mg total) by mouth daily. 30 capsule 0   lisdexamfetamine (VYVANSE ) 70 MG capsule Take 1 capsule (70 mg total) by mouth daily. 30 capsule 0   lisdexamfetamine (VYVANSE ) 70 MG capsule Take 1 capsule (70 mg total) by mouth daily. 30  capsule 0   meloxicam  (MOBIC ) 15 MG tablet Take 1 tablet (15 mg total) by mouth daily. 90 tablet 3   pantoprazole  (PROTONIX ) 40 MG tablet Take 1 tablet (40 mg total) by mouth 2 (two) times daily. 180 tablet 0   No current facility-administered medications for this visit.     Musculoskeletal: Strength & Muscle Tone: within normal limits Gait & Station: normal Patient leans: N/A  Psychiatric Specialty Exam: Review of Systems  Psychiatric/Behavioral:  Positive for dysphoric mood. The patient is nervous/anxious.   All other systems reviewed and are negative.   There were no vitals taken for this visit.There is no height or weight on file to calculate BMI.  General Appearance: Casual and Fairly Groomed  Eye Contact:  Good  Speech:  Clear and Coherent  Volume:  Normal  Mood:  Anxious and Dysphoric  Affect:  Flat  Thought Process:  Goal Directed  Orientation:  Full (Time, Place, and Person)  Thought Content: Rumination   Suicidal Thoughts:  No  Homicidal Thoughts:  No  Memory:  Immediate;   Good Recent;   Good Remote;   Good  Judgement:  Good  Insight:  Good  Psychomotor Activity:  Decreased  Concentration:  Concentration: Good and Attention Span: Good  Recall:  Good  Fund of Knowledge: Good  Language: Good  Akathisia:  No  Handed:  Right  AIMS (if indicated): not done  Assets:  Communication Skills Desire for Improvement Physical Health Resilience Social Support Talents/Skills Vocational/Educational  ADL's:  Intact  Cognition: WNL  Sleep:  Good    Screenings: GAD-7    Flowsheet Row Office Visit from 09/03/2022 in [facility]  Total GAD-7 Score 1   PHQ2-9    Flowsheet Row Video Visit from 10/10/2023 in [facility] [place] Office Visit from 09/03/2022 in [facility] Video Visit from 12/21/2021 in [facility] at [place] Video Visit from 09/23/2021 in [facility] [place] Video Visit from 07/09/2021 in [facility] Total Score 6 0 2 0 4  PHQ-9 Total Score 7 4 4  -- 8   Flowsheet Row Video Visit from 10/10/2023 in [facility] at [place] Video Visit from 12/21/2021 in [facility] at [place] Video Visit from 09/23/2021 in [facility] at [place]  C-SSRS RISK CATEGORY No Risk No Risk No Risk     Assessment and Plan: This patient is a 28 year old male with a history of bipolar disorder and ADHD.  He does not feel that much better with the Prozac  so we will increase it to 40 mg daily in addition to Wellbutrin  XL 300 mg daily for depression.  He will continue Vyvanse  70 mg every morning for ADD, decrease trazodone  to 50 mg at bedtime as needed for sleep and continue gabapentin  100 mg at bedtime for numbness tingling and anxiety as well as clonazepam  0.5 mg up to twice daily for anxiety.  He will return to see me in 4 weeks  Collaboration of Care: Collaboration of Care: Primary Care Provider AEB notes are shared with PCP on the epic system  Patient/Guardian was advised Release of Information must be obtained prior to any record release in order to collaborate their care with an outside provider. Patient/Guardian was advised if they have not already done so to contact the registration department to sign all necessary  forms in order for us  to release  information regarding their care.   Consent: Patient/Guardian gives verbal consent for treatment and assignment of benefits for services provided during this visit. Patient/Guardian expressed understanding and agreed to proceed.    [name], MD 12/05/2023, 4:09 PM

## 2023-12-12 ENCOUNTER — Encounter: Payer: Self-pay | Admitting: Family Medicine

## 2023-12-12 ENCOUNTER — Ambulatory Visit: Admitting: Family Medicine

## 2023-12-12 VITALS — BP 125/81 | HR 118 | Ht 63.0 in | Wt 159.0 lb

## 2023-12-12 DIAGNOSIS — G5623 Lesion of ulnar nerve, bilateral upper limbs: Secondary | ICD-10-CM | POA: Diagnosis not present

## 2023-12-12 DIAGNOSIS — R5383 Other fatigue: Secondary | ICD-10-CM

## 2023-12-12 DIAGNOSIS — M2550 Pain in unspecified joint: Secondary | ICD-10-CM

## 2023-12-12 MED ORDER — MELOXICAM 15 MG PO TABS
15.0000 mg | ORAL_TABLET | Freq: Every day | ORAL | 3 refills | Status: AC
Start: 2023-12-12 — End: ?

## 2023-12-12 NOTE — Progress Notes (Signed)
 BP 125/81   Pulse (!) 118   Ht 5' 3 (1.6 m)   Wt 159 lb (72.1 kg)   SpO2 97%   BMI 28.17 kg/m    Subjective:   Patient ID: [name], male    DOB: 11-Mar-1996, 28 y.o.   MRN: [id number]  HPI: [name] is a 28 y.o. male presenting on 12/12/2023 for Medical Management of Chronic Issues and Joint Pain (BUE- ? Refill Meloxicam )   Discussed the use of AI scribe software for clinical note transcription with the patient, who gave verbal consent to proceed.  History of Present Illness   [name] is a 28 year old male who presents for medication refills and follow-up for nerve pain.  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 The pain originates in the hand and travels up the arm, sometimes reaching the neck. He describes the pain as 'fire, pins and needles, whole shebang' when severe. The nerve pain is not consistently active but can become severe.  He currently takes gabapentin  as needed for nerve pain. He has been using meloxicam  daily, initially prescribed for arthritis, but it was restarted for nerve pain management after a gap in medication led to a spike in symptoms. He takes meloxicam  with food to avoid gastrointestinal discomfort. His current medications include Vyvanse , trazodone , Prozac , clonazepam  as needed, gabapentin  as needed, and meloxicam  daily. Prozac  and clonazepam  are new additions since his last visit.  He has not seen a neurologist for his nerve pain, although he has consulted various specialists in the past.  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 His job involves less typing than expected, with more time spent on planning and thinking. He is married and resides in the [place].        Relevant past medical, surgical, family and social history reviewed and updated as indicated. Interim medical history since our last visit  reviewed. Allergies and medications reviewed and updated.  Review of Systems  Constitutional:  Negative for chills and fever.  HENT:  Negative for ear pain and tinnitus.   Eyes:  Negative for pain and discharge.  Respiratory:  Negative for cough, shortness of breath and wheezing.   Cardiovascular:  Negative for chest pain, palpitations and leg swelling.  Gastrointestinal:  Negative for abdominal pain, blood in stool, constipation and diarrhea.  Genitourinary:  Negative for dysuria and hematuria.  Musculoskeletal:  Positive for arthralgias. Negative for back pain, gait problem and myalgias.  Skin:  Negative for rash.  Neurological:  Positive for numbness. Negative for dizziness, weakness and headaches.  Psychiatric/Behavioral:  Negative for suicidal ideas.   All other systems reviewed and are negative.   Per HPI unless specifically indicated above   Allergies as of 12/12/2023       Reactions   Sulfa Antibiotics Rash        Medication List        Accurate as of December 12, 2023  4:10 PM. If you have any questions, ask your nurse or doctor.          STOP taking these medications    amoxicillin -clavulanate 875-125 MG tablet Commonly known as: AUGMENTIN  Stopped by: [facility] LABOR Adonay Scheier   Vitamin D3 125 MCG (5000 UT) Caps Stopped by: Fonda LABOR Cherysh Epperly       TAKE these medications    buPROPion  300 MG 24 hr tablet  Commonly known as: Wellbutrin  XL Take 1 tablet (300 mg total) by mouth every morning.   clonazePAM  0.5 MG tablet Commonly known as: KlonoPIN  Take 1 tablet (0.5 mg total) by mouth 2 (two) times daily as needed for anxiety.   FLUoxetine  40 MG capsule Commonly known as: PROzac  Take 1 capsule (40 mg total) by mouth daily.   gabapentin  100 MG capsule Commonly known as: NEURONTIN  Take 1 capsule (100 mg total) by mouth at bedtime.   lisdexamfetamine 70 MG capsule Commonly known as: Vyvanse  Take 1 capsule (70 mg total) by mouth daily.   lisdexamfetamine  70 MG capsule Commonly known as: Vyvanse  Take 1 capsule (70 mg total) by mouth daily.   lisdexamfetamine 70 MG capsule Commonly known as: VYVANSE  Take 1 capsule (70 mg total) by mouth daily.   meloxicam  15 MG tablet Commonly known as: MOBIC  Take 1 tablet (15 mg total) by mouth daily.   pantoprazole  40 MG tablet Commonly known as: PROTONIX  Take 1 tablet (40 mg total) by mouth 2 (two) times daily.   traZODone  50 MG tablet Commonly known as: DESYREL  Take 1 tablet (50 mg total) by mouth at bedtime.         Objective:   BP 125/81   Pulse (!) 118   Ht 5' 3 (1.6 m)   Wt 159 lb (72.1 kg)   SpO2 97%   BMI 28.17 kg/m   Wt Readings from Last 3 Encounters:  12/12/23 159 lb (72.1 kg)  10/07/22 168 lb 6 oz (76.4 kg)  09/03/22 169 lb (76.7 kg)    Physical Exam Physical Exam   NECK: Thyroid  without nodules. CHEST: Lungs clear to auscultation bilaterally. CARDIOVASCULAR: Heart regular rate and rhythm. EXTREMITIES: No edema in lower extremities. MUSCULOSKELETAL: Strength 5/5 in all extremities.         Assessment & Plan:   Problem List Items Addressed This Visit   None Visit Diagnoses       Ulnar neuropathy of both upper extremities    -  Primary   Relevant Orders   CBC with Differential/Platelet   CMP14+EGFR   Lipid panel   TSH   Vitamin B12   Ambulatory referral to Neurology     Arthralgia of multiple joints       Relevant Medications   meloxicam  (MOBIC ) 15 MG tablet   Other Relevant Orders   CBC with Differential/Platelet   CMP14+EGFR   Lipid panel   TSH   Vitamin B12     Fatigue, unspecified type       Relevant Medications   meloxicam  (MOBIC ) 15 MG tablet   Other Relevant Orders   CBC with Differential/Platelet   CMP14+EGFR   Lipid panel   TSH   Vitamin B12           Chronic bilateral upper extremity neuropathic pain Chronic neuropathic pain in both upper extremities, exacerbated by typing, radiating from the hand up the arm and extending to  the neck. Differential includes ulnar tunnel syndrome and nerve impingement in the neck. - Refer to neurology for evaluation and electromyography. - Continue gabapentin  as needed. - Prescribe meloxicam  as needed.  Arthralgia Arthralgia managed with meloxicam , taken daily with food. - Continue meloxicam  as needed.  Depression Depression managed with Prozac .  Anxiety disorder Anxiety disorder managed with clonazepam  as needed.  Insomnia Insomnia managed with trazodone .  General Health Maintenance Routine health maintenance discussed, including monitoring liver and kidney function due to medication use. - Order blood work to assess liver and  kidney function and blood count.          Follow up plan: Return in about 1 year (around 12/11/2024), or if symptoms worsen or fail to improve, for Physical exam.  Counseling provided for all of the vaccine components Orders Placed This Encounter  Procedures   CBC with Differential/Platelet   CMP14+EGFR   Lipid panel   TSH   Vitamin B12   Ambulatory referral to Neurology    Fonda Levins, MD [facility] 12/12/2023, 4:10 PM

## 2023-12-13 DIAGNOSIS — F411 Generalized anxiety disorder: Secondary | ICD-10-CM | POA: Diagnosis not present

## 2023-12-13 DIAGNOSIS — F331 Major depressive disorder, recurrent, moderate: Secondary | ICD-10-CM | POA: Diagnosis not present

## 2023-12-13 DIAGNOSIS — F4310 Post-traumatic stress disorder, unspecified: Secondary | ICD-10-CM | POA: Diagnosis not present

## 2023-12-13 LAB — CMP14+EGFR
ALT: 17 IU/L (ref 0–44)
AST: 18 IU/L (ref 0–40)
Albumin: 4.9 g/dL (ref 4.3–5.2)
Alkaline Phosphatase: 63 IU/L (ref 44–121)
BUN/Creatinine Ratio: 12 (ref 9–20)
BUN: 12 mg/dL (ref 6–20)
Bilirubin Total: 0.6 mg/dL (ref 0.0–1.2)
CO2: 21 mmol/L (ref 20–29)
Calcium: 9.8 mg/dL (ref 8.7–10.2)
Chloride: 102 mmol/L (ref 96–106)
Creatinine, Ser: 1.03 mg/dL (ref 0.76–1.27)
Globulin, Total: 2 g/dL (ref 1.5–4.5)
Glucose: 82 mg/dL (ref 70–99)
Potassium: 4.1 mmol/L (ref 3.5–5.2)
Sodium: 139 mmol/L (ref 134–144)
Total Protein: 6.9 g/dL (ref 6.0–8.5)
eGFR: 103 mL/min/1.73 (ref >59)

## 2023-12-13 LAB — CBC WITH DIFFERENTIAL/PLATELET
Basophils Absolute: 0 x10E3/uL (ref 0.0–0.2)
Basos: 0 %
EOS (ABSOLUTE): 0.1 x10E3/uL (ref 0.0–0.4)
Eos: 1 %
Hematocrit: 47.8 % (ref 37.5–51.0)
Hemoglobin: 15.9 g/dL (ref 13.0–17.7)
Immature Grans (Abs): 0 x10E3/uL (ref 0.0–0.1)
Immature Granulocytes: 0 %
Lymphocytes Absolute: 2.3 x10E3/uL (ref 0.7–3.1)
Lymphs: 31 %
MCH: 29.9 pg (ref 26.6–33.0)
MCHC: 33.3 g/dL (ref 31.5–35.7)
MCV: 90 fL (ref 79–97)
Monocytes Absolute: 0.7 x10E3/uL (ref 0.1–0.9)
Monocytes: 10 %
Neutrophils Absolute: 4.2 x10E3/uL (ref 1.4–7.0)
Neutrophils: 58 %
Platelets: 316 x10E3/uL (ref 150–450)
RBC: 5.32 x10E6/uL (ref 4.14–5.80)
RDW: 13.1 % (ref 11.6–15.4)
WBC: 7.3 x10E3/uL (ref 3.4–10.8)

## 2023-12-13 LAB — TSH: TSH: 1.05 u[IU]/mL (ref 0.450–4.500)

## 2023-12-13 LAB — LIPID PANEL
Chol/HDL Ratio: 3.3 ratio (ref 0.0–5.0)
Cholesterol, Total: 113 mg/dL (ref 100–199)
HDL: 34 mg/dL — ABNORMAL LOW (ref >39)
LDL Chol Calc (NIH): 56 mg/dL (ref 0–99)
Triglycerides: 126 mg/dL (ref 0–149)
VLDL Cholesterol Cal: 23 mg/dL (ref 5–40)

## 2023-12-13 LAB — VITAMIN B12: Vitamin B-12: 435 pg/mL (ref 232–1245)

## 2023-12-16 ENCOUNTER — Ambulatory Visit: Payer: Self-pay | Admitting: Family Medicine

## 2023-12-24 DIAGNOSIS — F909 Attention-deficit hyperactivity disorder, unspecified type: Secondary | ICD-10-CM | POA: Diagnosis not present

## 2023-12-24 DIAGNOSIS — F331 Major depressive disorder, recurrent, moderate: Secondary | ICD-10-CM | POA: Diagnosis not present

## 2024-01-03 ENCOUNTER — Encounter (HOSPITAL_COMMUNITY): Payer: Self-pay | Admitting: Psychiatry

## 2024-01-03 ENCOUNTER — Telehealth (INDEPENDENT_AMBULATORY_CARE_PROVIDER_SITE_OTHER): Admitting: Psychiatry

## 2024-01-03 DIAGNOSIS — F331 Major depressive disorder, recurrent, moderate: Secondary | ICD-10-CM | POA: Diagnosis not present

## 2024-01-03 DIAGNOSIS — F909 Attention-deficit hyperactivity disorder, unspecified type: Secondary | ICD-10-CM | POA: Diagnosis not present

## 2024-01-03 DIAGNOSIS — F900 Attention-deficit hyperactivity disorder, predominantly inattentive type: Secondary | ICD-10-CM

## 2024-01-03 DIAGNOSIS — F3162 Bipolar disorder, current episode mixed, moderate: Secondary | ICD-10-CM | POA: Diagnosis not present

## 2024-01-03 MED ORDER — TRAZODONE HCL 50 MG PO TABS: 50.0000 mg | ORAL_TABLET | Freq: Every day | ORAL | 2 refills | Status: DC

## 2024-01-03 MED ORDER — LISDEXAMFETAMINE DIMESYLATE 70 MG PO CAPS: 70.0000 mg | ORAL_CAPSULE | Freq: Every day | ORAL | 0 refills | Status: DC

## 2024-01-03 MED ORDER — CLONAZEPAM 0.5 MG PO TABS: 0.5000 mg | ORAL_TABLET | Freq: Two times a day (BID) | ORAL | 2 refills | Status: DC | PRN

## 2024-01-03 MED ORDER — BUPROPION HCL ER (XL) 300 MG PO TB24: 300.0000 mg | ORAL_TABLET | ORAL | 2 refills | Status: DC

## 2024-01-03 MED ORDER — FLUOXETINE HCL 40 MG PO CAPS: 40.0000 mg | ORAL_CAPSULE | Freq: Every day | ORAL | 2 refills | Status: DC

## 2024-01-03 MED ORDER — GABAPENTIN 100 MG PO CAPS: 100.0000 mg | ORAL_CAPSULE | Freq: Every day | ORAL | 2 refills | Status: DC

## 2024-01-03 NOTE — Progress Notes (Signed)
 Virtual Visit via Video Note  I connected with Ryan [name] on 01/03/24 at  8:40 AM EDT by a video enabled telemedicine application and verified that I am speaking with the correct person using two identifiers.  Location: Patient: home Provider: office   I discussed the limitations of evaluation and management by telemedicine and the availability of in person appointments. The patient expressed understanding and agreed to proceed.      I discussed the assessment and treatment plan with the patient. The patient was provided an opportunity to ask questions and all were answered. The patient agreed with the plan and demonstrated an understanding of the instructions.   The patient was advised to call back or seek an in-person evaluation if the symptoms worsen or if the condition fails to improve as anticipated.  I provided 20 minutes of non-face-to-face time during this encounter.   [name], MD  [facility] MD/PA/NP OP Progress Note  01/03/2024 8:52 AM Ryan [name]  MRN:  [id number]  Chief Complaint:  Chief Complaint  Patient presents with   Anxiety   Depression   ADD   Follow-up   HPI:  This patient is a 28 year old married white male lives with his wife in [place]. 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  Last time he was still feeling quite depressed and unmotivated.  We did increase the Prozac  from 20 to 40 mg daily.  He states he is now feeling better.  He is still having some trouble with motivation but not as much.  He is not quite as depressed.  His anxiety is under good control.  He is sleeping well.  He is focusing well.  He denies suicidal ideation.  The patient states that he and his wife are in marital therapy but he has made the decision that he is going to end the marriage soon.  He does not feel like they are connecting  and does not think the therapy is helping.  He has not told his wife this yet and I imagine that the wait of this decision has had a lot to do with his mood problems and he agrees.  He thinks he will feel better once everything is settled. Visit Diagnosis:    ICD-10-CM   1. Bipolar 1 disorder, mixed, moderate (HCC)  F31.62     2. Attention deficit hyperactivity disorder (ADHD), predominantly inattentive type  F90.0       Past Psychiatric History: Long-term outpatient treatment  Past Medical History:  Past Medical History:  Diagnosis Date   Allergy    seasonal   Anxiety    Bipolar affective disorder (HCC)    Depression    GERD (gastroesophageal reflux disease)    Seizures (HCC)    seizures began in NOV 2013 ,last was 2014    Past Surgical History:  Procedure Laterality Date   TONSILLECTOMY      Family Psychiatric History: See below  Family History:  Family History  Problem Relation Age of Onset   ADD / ADHD Father    Mood Disorder Father    ADD / ADHD Sister    Mood Disorder Sister    Bipolar disorder Sister    ADD / ADHD Brother    Mood Disorder Brother    Bipolar disorder Brother    Depression Maternal Grandfather    Mood Disorder Paternal Grandfather  Mood Disorder Paternal Grandmother    ADD / ADHD Sister    Mood Disorder Sister    Bipolar disorder Sister    ADD / ADHD Brother    Mood Disorder Brother    Bipolar disorder Brother     Social History:  Social History   Socioeconomic History   Marital status: Married    Spouse name: Not on file   Number of children: Not on file   Years of education: Not on file   Highest education level: 12th grade  Occupational History   Not on file  Tobacco Use   Smoking status: Never   Smokeless tobacco: Never  Vaping Use   Vaping status: Never Used  Substance and Sexual Activity   Alcohol use: No    Alcohol/week: 0.0 standard drinks of alcohol    Comment: 12-17-2015 per pt no    Drug use: No    Comment:  12-17-15 per pt no    Sexual activity: Never  Other Topics Concern   Not on file  Social History Narrative   Married   Employed full-time   No alcohol tobacco or drug use   Social Drivers of Corporate investment banker Strain: Low Risk  (12/11/2023)   Overall Financial Resource Strain (CARDIA)    Difficulty of Paying Living Expenses: Not hard at all  Food Insecurity: No Food Insecurity (12/11/2023)   Hunger Vital Sign    Worried About Running Out of Food in the Last Year: Never true    Ran Out of Food in the Last Year: Never true  Transportation Needs: No Transportation Needs (12/11/2023)   PRAPARE - Administrator, Civil Service (Medical): No    Lack of Transportation (Non-Medical): No  Physical Activity: Inactive (12/11/2023)   Exercise Vital Sign    Days of Exercise per Week: 0 days    Minutes of Exercise per Session: Not on file  Stress: Stress Concern Present (12/11/2023)   [employer] of Occupational Health - Occupational Stress Questionnaire    Feeling of Stress: Very much  Social Connections: Socially Integrated (12/11/2023)   Social Connection and Isolation Panel    Frequency of Communication with Friends and Family: More than three times a week    Frequency of Social Gatherings with Friends and Family: Twice a week    Attends Religious Services: More than 4 times per year    Active Member of [employer] or Organizations: Yes    Attends Engineer, structural: More than 4 times per year    Marital Status: Married    Allergies:  Allergies  Allergen Reactions   Sulfa Antibiotics Rash    Metabolic Disorder Labs: Lab Results  Component Value Date   HGBA1C 5.3 12/25/2018   No results found for: PROLACTIN Lab Results  Component Value Date   CHOL 113 12/12/2023   TRIG 126 12/12/2023   HDL 34 (L) 12/12/2023   CHOLHDL 3.3 12/12/2023   LDLCALC 56 12/12/2023   Lab Results  Component Value Date   TSH 1.050 12/12/2023   TSH 1.610 02/06/2019     Therapeutic Level Labs: No results found for: LITHIUM Lab Results  Component Value Date   VALPROATE 106 (H) 05/18/2013   VALPROATE 64.9 03/10/2012   No results found for: CBMZ  Current Medications: Current Outpatient Medications  Medication Sig Dispense Refill   buPROPion  (WELLBUTRIN  XL) 300 MG 24 hr tablet Take 1 tablet (300 mg total) by mouth every morning. 30 tablet 2  clonazePAM  (KLONOPIN ) 0.5 MG tablet Take 1 tablet (0.5 mg total) by mouth 2 (two) times daily as needed for anxiety. 60 tablet 2   FLUoxetine  (PROZAC ) 40 MG capsule Take 1 capsule (40 mg total) by mouth daily. 30 capsule 2   gabapentin  (NEURONTIN ) 100 MG capsule Take 1 capsule (100 mg total) by mouth at bedtime. 30 capsule 2   lisdexamfetamine (VYVANSE ) 70 MG capsule Take 1 capsule (70 mg total) by mouth daily. 30 capsule 0   lisdexamfetamine (VYVANSE ) 70 MG capsule Take 1 capsule (70 mg total) by mouth daily. 30 capsule 0   meloxicam  (MOBIC ) 15 MG tablet Take 1 tablet (15 mg total) by mouth daily. 90 tablet 3   pantoprazole  (PROTONIX ) 40 MG tablet Take 1 tablet (40 mg total) by mouth 2 (two) times daily. 180 tablet 0   traZODone  (DESYREL ) 50 MG tablet Take 1 tablet (50 mg total) by mouth at bedtime. 30 tablet 2   No current facility-administered medications for this visit.     Musculoskeletal: Strength & Muscle Tone: within normal limits Gait & Station: normal Patient leans: N/A  Psychiatric Specialty Exam: Review of Systems  Psychiatric/Behavioral:  Positive for dysphoric mood.   All other systems reviewed and are negative.   There were no vitals taken for this visit.There is no height or weight on file to calculate BMI.  General Appearance: Casual and Fairly Groomed  Eye Contact:  Good  Speech:  Clear and Coherent  Volume:  Normal  Mood:  Dysphoric still a little bit but much improved  Affect:  Congruent  Thought Process:  Goal Directed  Orientation:  Full (Time, Place, and Person)  Thought  Content: WDL   Suicidal Thoughts:  No  Homicidal Thoughts:  No  Memory:  Immediate;   Good Recent;   Good Remote;   Good  Judgement:  Good  Insight:  Good  Psychomotor Activity:  Decreased  Concentration:  Concentration: Good and Attention Span: Good  Recall:  Good  Fund of Knowledge: Good  Language: Good  Akathisia:  No  Handed:  Right  AIMS (if indicated): not done  Assets:  Communication Skills Desire for Improvement Physical Health Resilience Social Support Talents/Skills Vocational/Educational  ADL's:  Intact  Cognition: WNL  Sleep:  Good   Screenings: GAD-7    Flowsheet Row Office Visit from 12/12/2023 in [facility] Office Visit from 09/03/2022 in [facility]  Total GAD-7 Score 2 1   PHQ2-9    Flowsheet Row Office Visit from 12/12/2023 in [facility] Video Visit from 10/10/2023 in [facility] [place] Office Visit from 09/03/2022 in [facility] Video Visit from 12/21/2021 in [facility] [place] Video Visit from 09/23/2021 in [facility] Total Score 6 6 0 2 0  PHQ-9 Total Score 12 7 4 4  --   Flowsheet Row Video Visit from 10/10/2023 in [facility] [place] Video Visit from 12/21/2021 in [facility] [place] Video Visit from 09/23/2021 in [facility] at [place]  C-SSRS RISK CATEGORY No Risk No Risk No Risk     Assessment and Plan: This patient is a 28 year old male with a history of bipolar disorder and ADHD.  He is feeling better since Prozac  was increased to 40 mg daily in addition to Wellbutrin  XL 300  mg daily for depression so these will be continued.  He will continue Vyvanse  70 mg every morning for ADHD, trazodone  50 mg at  bedtime as needed for sleep, gabapentin  100 mg at bedtime for numbness tingling and anxiety as well as clonazepam  0.5 mg twice daily as needed for anxiety.  He will return to see me in 6 weeks  Collaboration of Care: Collaboration of Care: Primary Care Provider AEB notes are shared with PCP on the epic system  Patient/Guardian was advised Release of Information must be obtained prior to any record release in order to collaborate their care with an outside provider. Patient/Guardian was advised if they have not already done so to contact the registration department to sign all necessary forms in order for us  to release information regarding their care.   Consent: Patient/Guardian gives verbal consent for treatment and assignment of benefits for services provided during this visit. Patient/Guardian expressed understanding and agreed to proceed.    [name], MD 01/03/2024, 8:52 AM

## 2024-01-17 DIAGNOSIS — F909 Attention-deficit hyperactivity disorder, unspecified type: Secondary | ICD-10-CM | POA: Diagnosis not present

## 2024-01-17 DIAGNOSIS — F331 Major depressive disorder, recurrent, moderate: Secondary | ICD-10-CM | POA: Diagnosis not present

## 2024-02-02 ENCOUNTER — Other Ambulatory Visit: Payer: Self-pay | Admitting: Family Medicine

## 2024-02-08 ENCOUNTER — Encounter (HOSPITAL_COMMUNITY): Payer: Self-pay | Admitting: Psychiatry

## 2024-02-08 ENCOUNTER — Telehealth (INDEPENDENT_AMBULATORY_CARE_PROVIDER_SITE_OTHER): Payer: Self-pay | Admitting: Psychiatry

## 2024-02-08 DIAGNOSIS — F3162 Bipolar disorder, current episode mixed, moderate: Secondary | ICD-10-CM

## 2024-02-08 DIAGNOSIS — F419 Anxiety disorder, unspecified: Secondary | ICD-10-CM | POA: Diagnosis not present

## 2024-02-08 DIAGNOSIS — F32A Depression, unspecified: Secondary | ICD-10-CM | POA: Diagnosis not present

## 2024-02-08 DIAGNOSIS — F900 Attention-deficit hyperactivity disorder, predominantly inattentive type: Secondary | ICD-10-CM | POA: Diagnosis not present

## 2024-02-08 MED ORDER — LISDEXAMFETAMINE DIMESYLATE 70 MG PO CAPS: 70.0000 mg | ORAL_CAPSULE | Freq: Every day | ORAL | 0 refills | Status: DC

## 2024-02-08 MED ORDER — TRAZODONE HCL 50 MG PO TABS: 50.0000 mg | ORAL_TABLET | Freq: Every day | ORAL | 2 refills | Status: DC

## 2024-02-08 MED ORDER — FLUOXETINE HCL 40 MG PO CAPS: 40.0000 mg | ORAL_CAPSULE | Freq: Every day | ORAL | 2 refills | Status: DC

## 2024-02-08 MED ORDER — GABAPENTIN 100 MG PO CAPS: 100.0000 mg | ORAL_CAPSULE | Freq: Every day | ORAL | 2 refills | Status: DC

## 2024-02-08 MED ORDER — CLONAZEPAM 0.5 MG PO TABS: 0.5000 mg | ORAL_TABLET | Freq: Two times a day (BID) | ORAL | 2 refills | Status: DC | PRN

## 2024-02-08 MED ORDER — BUPROPION HCL ER (XL) 300 MG PO TB24: 300.0000 mg | ORAL_TABLET | ORAL | 2 refills | Status: DC

## 2024-02-08 NOTE — Progress Notes (Signed)
 Virtual Visit via Video Note  I connected with Ryan [name] on 02/08/24 at  1:40 PM EDT by a video enabled telemedicine application and verified that I am speaking with the correct person using two identifiers.  Location: Patient: home Provider: office   I discussed the limitations of evaluation and management by telemedicine and the availability of in person appointments. The patient expressed understanding and agreed to proceed.     I discussed the assessment and treatment plan with the patient. The patient was provided an opportunity to ask questions and all were answered. The patient agreed with the plan and demonstrated an understanding of the instructions.   The patient was advised to call back or seek an in-person evaluation if the symptoms worsen or if the condition fails to improve as anticipated.  I provided 20 minutes of non-face-to-face time during this encounter.   [name], MD  [facility] MD/PA/NP OP Progress Note  02/08/2024 1:57 PM Ryan [name]  MRN:  [id number]  Chief Complaint:  Chief Complaint  Patient presents with   Anxiety   Depression   Follow-up   ADHD   HPI: This patient is a 28 year old separated white male who is now living with his uncle in [place].  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  The patient returns for follow-up after 6 weeks regarding his depression anxiety presumed bipolar disorder and ADD.  He states that he and his wife have separated.  This was mostly because he was not happy in the marriage and felt like she had not made changes that he had requested.  They have not really talked much while he been apart for 4 weeks.  Apparently she claims to be making some of the changes he requested but he cannot be sure.  He is not sure where this is going to go.  He is in individual therapy trying to sort things out.  The patient denies being depressed but claims he is not very motivated.  Sometimes it is hard for  him to get going in the mornings and take a shower and get dressed but he always does it.  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  He does think the medications have helped his mood depression and sleep.  He is getting good sleep.  He is spending time with friends and family and he denies thoughts of self-harm or suicide Visit Diagnosis:    ICD-10-CM   1. Attention deficit hyperactivity disorder (ADHD), predominantly inattentive type  F90.0     2. Bipolar 1 disorder, mixed, moderate (HCC)  F31.62       Past Psychiatric History: Long-term outpatient treatment  Past Medical History:  Past Medical History:  Diagnosis Date   Allergy    seasonal   Anxiety    Bipolar affective disorder (HCC)    Depression    GERD (gastroesophageal reflux disease)    Seizures (HCC)    seizures began in NOV 2013 ,last was 2014    Past Surgical History:  Procedure Laterality Date   TONSILLECTOMY      Family Psychiatric History: See below  Family History:  Family History  Problem Relation Age of Onset   ADD / ADHD Father    Mood Disorder Father    ADD / ADHD Sister    Mood Disorder Sister    Bipolar disorder Sister    ADD /  ADHD Brother    Mood Disorder Brother    Bipolar disorder Brother    Depression Maternal Grandfather    Mood Disorder Paternal Grandfather    Mood Disorder Paternal Grandmother    ADD / ADHD Sister    Mood Disorder Sister    Bipolar disorder Sister    ADD / ADHD Brother    Mood Disorder Brother    Bipolar disorder Brother     Social History:  Social History   Socioeconomic History   Marital status: Married    Spouse name: Not on file   Number of children: Not on file   Years of education: Not on file   Highest education level: 12th grade  Occupational History   Not on file  Tobacco Use   Smoking status: Never   Smokeless tobacco: Never  Vaping Use   Vaping status: Never Used  Substance and  Sexual Activity   Alcohol use: No    Alcohol/week: 0.0 standard drinks of alcohol    Comment: 12-17-2015 per pt no    Drug use: No    Comment: 12-17-15 per pt no    Sexual activity: Never  Other Topics Concern   Not on file  Social History Narrative   Married   Employed full-time   No alcohol tobacco or drug use   Social Drivers of Corporate investment banker Strain: Low Risk  (12/11/2023)   Overall Financial Resource Strain (CARDIA)    Difficulty of Paying Living Expenses: Not hard at all  Food Insecurity: No Food Insecurity (12/11/2023)   Hunger Vital Sign    Worried About Running Out of Food in the Last Year: Never true    Ran Out of Food in the Last Year: Never true  Transportation Needs: No Transportation Needs (12/11/2023)   PRAPARE - Administrator, Civil Service (Medical): No    Lack of Transportation (Non-Medical): No  Physical Activity: Inactive (12/11/2023)   Exercise Vital Sign    Days of Exercise per Week: 0 days    Minutes of Exercise per Session: Not on file  Stress: Stress Concern Present (12/11/2023)   [employer] of Occupational Health - Occupational Stress Questionnaire    Feeling of Stress: Very much  Social Connections: Socially Integrated (12/11/2023)   Social Connection and Isolation Panel    Frequency of Communication with Friends and Family: More than three times a week    Frequency of Social Gatherings with Friends and Family: Twice a week    Attends Religious Services: More than 4 times per year    Active Member of [employer] or Organizations: Yes    Attends Engineer, structural: More than 4 times per year    Marital Status: Married    Allergies:  Allergies  Allergen Reactions   Sulfa Antibiotics Rash    Metabolic Disorder Labs: Lab Results  Component Value Date   HGBA1C 5.3 12/25/2018   No results found for: PROLACTIN Lab Results  Component Value Date   CHOL 113 12/12/2023   TRIG 126 12/12/2023   HDL 34 (L)  12/12/2023   CHOLHDL 3.3 12/12/2023   LDLCALC 56 12/12/2023   Lab Results  Component Value Date   TSH 1.050 12/12/2023   TSH 1.610 02/06/2019    Therapeutic Level Labs: No results found for: LITHIUM Lab Results  Component Value Date   VALPROATE 106 (H) 05/18/2013   VALPROATE 64.9 03/10/2012   No results found for: CBMZ  Current Medications: Current Outpatient Medications  Medication Sig Dispense Refill   buPROPion  (WELLBUTRIN  XL) 300 MG 24 hr tablet Take 1 tablet (300 mg total) by mouth every morning. 30 tablet 2   clonazePAM  (KLONOPIN ) 0.5 MG tablet Take 1 tablet (0.5 mg total) by mouth 2 (two) times daily as needed for anxiety. 60 tablet 2   FLUoxetine  (PROZAC ) 40 MG capsule Take 1 capsule (40 mg total) by mouth daily. 30 capsule 2   gabapentin  (NEURONTIN ) 100 MG capsule Take 1 capsule (100 mg total) by mouth at bedtime. 30 capsule 2   lisdexamfetamine (VYVANSE ) 70 MG capsule Take 1 capsule (70 mg total) by mouth daily. 30 capsule 0   lisdexamfetamine (VYVANSE ) 70 MG capsule Take 1 capsule (70 mg total) by mouth daily. 30 capsule 0   meloxicam  (MOBIC ) 15 MG tablet Take 1 tablet (15 mg total) by mouth daily. 90 tablet 3   pantoprazole  (PROTONIX ) 40 MG tablet TAKE 1 TABLET BY MOUTH 2 TIMES A DAY 180 tablet 1   traZODone  (DESYREL ) 50 MG tablet Take 1 tablet (50 mg total) by mouth at bedtime. 30 tablet 2   No current facility-administered medications for this visit.     Musculoskeletal: Strength & Muscle Tone: within normal limits Gait & Station: normal Patient leans: N/A  Psychiatric Specialty Exam: Review of Systems  Psychiatric/Behavioral:  The patient is nervous/anxious.   All other systems reviewed and are negative.   There were no vitals taken for this visit.There is no height or weight on file to calculate BMI.  General Appearance: Casual, Neat, and Well Groomed  Eye Contact:  Good  Speech:  Clear and Coherent  Volume:  Normal  Mood:  Anxious and Euthymic   Affect:  Congruent  Thought Process:  Goal Directed  Orientation:  Full (Time, Place, and Person)  Thought Content: Rumination   Suicidal Thoughts:  No  Homicidal Thoughts:  No  Memory:  Immediate;   Good Recent;   Good Remote;   Good  Judgement:  Good  Insight:  Good  Psychomotor Activity:  Decreased  Concentration:  Concentration: Good and Attention Span: Good  Recall:  Good  Fund of Knowledge: Good  Language: Good  Akathisia:  No  Handed:  Right  AIMS (if indicated): not done  Assets:  Communication Skills Desire for Improvement Physical Health Resilience Social Support Talents/Skills Vocational/Educational  ADL's:  Intact  Cognition: WNL  Sleep:  Good   Screenings: GAD-7    Flowsheet Row Office Visit from 12/12/2023 in [facility] Office Visit from 09/03/2022 in [facility]  Total GAD-7 Score 2 1   PHQ2-9    Flowsheet Row Office Visit from 12/12/2023 in [facility] Video Visit from 10/10/2023 in [facility] [place] Office Visit from 09/03/2022 in [facility] Video Visit from 12/21/2021 in [facility] [place] Video Visit from 09/23/2021 in [facility] Total Score 6 6 0 2 0  PHQ-9 Total Score 12 7 4 4  --   Flowsheet Row Video Visit from 10/10/2023 in [facility] [place] Video Visit from 12/21/2021 in [facility] [place] Video Visit from 09/23/2021 in [facility] at [place]  C-SSRS RISK CATEGORY No Risk No Risk No Risk     Assessment and Plan: This patient is a 28 year old male with a history of bipolar  disorder and ADHD.  He is going through a difficult time as his marriage may be ending and he also has to find a new job.   However he feels like his medications are still helping.  He will continue Prozac  40 mg daily in addition to Wellbutrin  XL 300 mg daily for depression, Vyvanse  70 mg every morning for ADHD, trazodone  50 mg at bedtime as needed for sleep, gabapentin  100 mg at bedtime for numbness tingling and anxiety as well as clonazepam  0.5 mg twice daily as needed for anxiety.  He will return to see me in 2 months  Collaboration of Care: Collaboration of Care: Primary Care Provider AEB notes are shared with PCP on the epic system  Patient/Guardian was advised Release of Information must be obtained prior to any record release in order to collaborate their care with an outside provider. Patient/Guardian was advised if they have not already done so to contact the registration department to sign all necessary forms in order for us  to release information regarding their care.   Consent: Patient/Guardian gives verbal consent for treatment and assignment of benefits for services provided during this visit. Patient/Guardian expressed understanding and agreed to proceed.    [name], MD 02/08/2024, 1:57 PM

## 2024-04-09 ENCOUNTER — Telehealth (INDEPENDENT_AMBULATORY_CARE_PROVIDER_SITE_OTHER): Admitting: Psychiatry

## 2024-04-09 ENCOUNTER — Encounter (HOSPITAL_COMMUNITY): Payer: Self-pay | Admitting: Psychiatry

## 2024-04-09 DIAGNOSIS — F900 Attention-deficit hyperactivity disorder, predominantly inattentive type: Secondary | ICD-10-CM

## 2024-04-09 DIAGNOSIS — F3162 Bipolar disorder, current episode mixed, moderate: Secondary | ICD-10-CM

## 2024-04-09 MED ORDER — LISDEXAMFETAMINE DIMESYLATE 70 MG PO CAPS: 70.0000 mg | ORAL_CAPSULE | Freq: Every day | ORAL | 0 refills | Status: AC

## 2024-04-09 MED ORDER — BUPROPION HCL ER (XL) 300 MG PO TB24: 300.0000 mg | ORAL_TABLET | ORAL | 2 refills | Status: AC

## 2024-04-09 MED ORDER — TRAZODONE HCL 50 MG PO TABS: 50.0000 mg | ORAL_TABLET | Freq: Every day | ORAL | 2 refills | Status: AC

## 2024-04-09 MED ORDER — FLUOXETINE HCL 40 MG PO CAPS: 40.0000 mg | ORAL_CAPSULE | Freq: Every day | ORAL | 2 refills | Status: AC

## 2024-04-09 MED ORDER — GABAPENTIN 100 MG PO CAPS: 100.0000 mg | ORAL_CAPSULE | Freq: Every day | ORAL | 2 refills | Status: AC

## 2024-04-09 MED ORDER — CLONAZEPAM 0.5 MG PO TABS: 0.5000 mg | ORAL_TABLET | Freq: Two times a day (BID) | ORAL | 2 refills | Status: AC | PRN

## 2024-04-09 NOTE — Progress Notes (Signed)
 [facility] MD/PA/NP OP Progress Note  04/09/2024 1:13 PM Ryan [name]  MRN:  [id number]  Chief Complaint:  Chief Complaint  Patient presents with   Anxiety   Depression   Follow-up   ADD   HPI: This patient is a 28 year old separated white male who is now living with his uncle in [place]. 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   The patient returns after 2 months regarding his bipolar disorder depression and generalized anxiety disorder as well as ADD.  He has been going through a lot of changes recently.  The company he works for got bought by a [employer].  For a while he thought he was going to lose his job.  However he just found out today that he is going to be kept on but being put in a new role.  He was very relieved to hear this.  He states that he and his wife are working in couples therapy and things have improved between them.  He thinks the communication has improved.  They see each other every Saturday.  He is so far happy with the arrangement.  He has been spending time with family and friends.  He does admit that he has been stressed lately but denies significant depression suicidal thoughts or thoughts of self-harm.  He is sleeping well.  His anxiety is well-controlled and he is focusing well. Visit Diagnosis:    ICD-10-CM   1. Attention deficit hyperactivity disorder (ADHD), predominantly inattentive type  F90.0     2. Bipolar 1 disorder, mixed, moderate (HCC)  F31.62       Past Psychiatric History: Long-term outpatient treatment  Past Medical History:  Past Medical History:  Diagnosis Date   Allergy    seasonal   Anxiety    Bipolar affective disorder (HCC)    Depression    GERD (gastroesophageal reflux disease)    Seizures (HCC)    seizures began in NOV 2013 ,last was 2014    Past Surgical History:  Procedure Laterality Date   TONSILLECTOMY      Family Psychiatric History: See below  Family History:  Family History   Problem Relation Age of Onset   ADD / ADHD Father    Mood Disorder Father    ADD / ADHD Sister    Mood Disorder Sister    Bipolar disorder Sister    ADD / ADHD Brother    Mood Disorder Brother    Bipolar disorder Brother    Depression Maternal Grandfather    Mood Disorder Paternal Grandfather    Mood Disorder Paternal Grandmother    ADD / ADHD Sister    Mood Disorder Sister    Bipolar disorder Sister    ADD / ADHD Brother    Mood Disorder Brother    Bipolar disorder Brother     Social History:  Social History   Socioeconomic History   Marital status: Married    Spouse name: Not on file   Number of children: Not on file   Years of education: Not on file   Highest education level: 12th grade  Occupational History   Not on file  Tobacco Use   Smoking status: Never   Smokeless tobacco: Never  Vaping Use   Vaping status: Never Used  Substance and Sexual Activity   Alcohol use: No    Alcohol/week: 0.0 standard drinks of alcohol    Comment: 12-17-2015 per pt no    Drug use: No  Comment: 12-17-15 per pt no    Sexual activity: Never  Other Topics Concern   Not on file  Social History Narrative   Married   Employed full-time   No alcohol tobacco or drug use   Social Drivers of Corporate Investment Banker Strain: Low Risk  (12/11/2023)   Overall Financial Resource Strain (CARDIA)    Difficulty of Paying Living Expenses: Not hard at all  Food Insecurity: No Food Insecurity (12/11/2023)   Hunger Vital Sign    Worried About Running Out of Food in the Last Year: Never true    Ran Out of Food in the Last Year: Never true  Transportation Needs: No Transportation Needs (12/11/2023)   PRAPARE - Administrator, Civil Service (Medical): No    Lack of Transportation (Non-Medical): No  Physical Activity: Inactive (12/11/2023)   Exercise Vital Sign    Days of Exercise per Week: 0 days    Minutes of Exercise per Session: Not on file  Stress: Stress Concern Present  (12/11/2023)   [employer] of Occupational Health - Occupational Stress Questionnaire    Feeling of Stress: Very much  Social Connections: Socially Integrated (12/11/2023)   Social Connection and Isolation Panel    Frequency of Communication with Friends and Family: More than three times a week    Frequency of Social Gatherings with Friends and Family: Twice a week    Attends Religious Services: More than 4 times per year    Active Member of [employer] or Organizations: Yes    Attends Engineer, Structural: More than 4 times per year    Marital Status: Married    Allergies:  Allergies  Allergen Reactions   Sulfa Antibiotics Rash    Metabolic Disorder Labs: Lab Results  Component Value Date   HGBA1C 5.3 12/25/2018   No results found for: PROLACTIN Lab Results  Component Value Date   CHOL 113 12/12/2023   TRIG 126 12/12/2023   HDL 34 (L) 12/12/2023   CHOLHDL 3.3 12/12/2023   LDLCALC 56 12/12/2023   Lab Results  Component Value Date   TSH 1.050 12/12/2023   TSH 1.610 02/06/2019    Therapeutic Level Labs: No results found for: LITHIUM Lab Results  Component Value Date   VALPROATE 106 (H) 05/18/2013   VALPROATE 64.9 03/10/2012   No results found for: CBMZ  Current Medications: Current Outpatient Medications  Medication Sig Dispense Refill   buPROPion  (WELLBUTRIN  XL) 300 MG 24 hr tablet Take 1 tablet (300 mg total) by mouth every morning. 30 tablet 2   clonazePAM  (KLONOPIN ) 0.5 MG tablet Take 1 tablet (0.5 mg total) by mouth 2 (two) times daily as needed for anxiety. 60 tablet 2   FLUoxetine  (PROZAC ) 40 MG capsule Take 1 capsule (40 mg total) by mouth daily. 30 capsule 2   gabapentin  (NEURONTIN ) 100 MG capsule Take 1 capsule (100 mg total) by mouth at bedtime. 30 capsule 2   lisdexamfetamine (VYVANSE ) 70 MG capsule Take 1 capsule (70 mg total) by mouth daily. 30 capsule 0   lisdexamfetamine (VYVANSE ) 70 MG capsule Take 1 capsule (70 mg total) by mouth  daily. 30 capsule 0   meloxicam  (MOBIC ) 15 MG tablet Take 1 tablet (15 mg total) by mouth daily. 90 tablet 3   pantoprazole  (PROTONIX ) 40 MG tablet TAKE 1 TABLET BY MOUTH 2 TIMES A DAY 180 tablet 1   traZODone  (DESYREL ) 50 MG tablet Take 1 tablet (50 mg total) by mouth at bedtime. 30  tablet 2   No current facility-administered medications for this visit.     Musculoskeletal: Strength & Muscle Tone: within normal limits Gait & Station: normal Patient leans: N/A  Psychiatric Specialty Exam: Review of Systems  All other systems reviewed and are negative.   There were no vitals taken for this visit.There is no height or weight on file to calculate BMI.  General Appearance: Casual and Fairly Groomed  Eye Contact:  Good  Speech:  Clear and Coherent  Volume:  Normal  Mood:  Euthymic  Affect:  Congruent  Thought Process:  Goal Directed  Orientation:  Full (Time, Place, and Person)  Thought Content: WDL   Suicidal Thoughts:  No  Homicidal Thoughts:  No  Memory:  Immediate;   Good Recent;   Good Remote;   NA  Judgement:  Good  Insight:  Good  Psychomotor Activity:  Normal  Concentration:  Concentration: Good and Attention Span: Good  Recall:  Good  Fund of Knowledge: Good  Language: Good  Akathisia:  No  Handed:  Right  AIMS (if indicated): not done  Assets:  Communication Skills Desire for Improvement Physical Health Resilience Social Support Talents/Skills Vocational/Educational  ADL's:  Intact  Cognition: WNL  Sleep:  Good   Screenings: GAD-7    Flowsheet Row Office Visit from 12/12/2023 in [facility] Office Visit from 09/03/2022 in [facility]  Total GAD-7 Score 2 1   PHQ2-9    Flowsheet Row Office Visit from 12/12/2023 in [facility] Video Visit from 10/10/2023 in [facility] [place] Office Visit from 09/03/2022 in [facility] Video Visit from 12/21/2021 in [facility] [place] Video Visit from 09/23/2021 in [facility] Total Score 6 6 0 2 0  PHQ-9 Total Score 12 7 4 4  --   Flowsheet Row Video Visit from 10/10/2023 in [facility] [place] Video Visit from 12/21/2021 in [facility] [place] Video Visit from 09/23/2021 in [facility]  C-SSRS RISK CATEGORY No Risk No Risk No Risk     Assessment and Plan:  This patient is a 28 year old male with a history of bipolar disorder with associated depression and generalized anxiety and ADHD.  He does feel like his medications are helpful although he is gone through some difficult transitions recently.  He will continue Prozac  40 mg daily in addition to Wellbutrin  XL 300 mg daily for depression, Vyvanse  70 mg every morning for ADHD trazodone  50 mg at bedtime as needed for sleep gabapentin  100 mg at bedtime for anxiety and clonazepam  0.5 mg twice daily as needed for anxiety.  He will return to see me in 2 months Collaboration of Care: Collaboration of Care: Primary Care Provider AEB notes are shared with PCP on the epic system  Patient/Guardian was advised Release of Information must be obtained prior to any record release in order to collaborate their care with an outside provider. Patient/Guardian was advised if they have not already done so to contact the registration department to sign all necessary forms in order for us  to release information regarding their care.   Consent: Patient/Guardian gives verbal consent for treatment and assignment of benefits for services provided during this visit. Patient/Guardian expressed understanding and agreed to proceed.    [name], MD 04/09/2024, 1:13  PM

## 2024-05-16 ENCOUNTER — Encounter: Payer: Self-pay | Admitting: Diagnostic Neuroimaging

## 2024-05-16 ENCOUNTER — Ambulatory Visit: Admitting: Diagnostic Neuroimaging

## 2024-05-16 VITALS — BP 137/85 | HR 131 | Ht 63.0 in | Wt 168.8 lb

## 2024-05-16 DIAGNOSIS — R200 Anesthesia of skin: Secondary | ICD-10-CM

## 2024-05-16 NOTE — Patient Instructions (Signed)
-   refer to OT evaluation for eval and tx

## 2024-05-16 NOTE — Progress Notes (Signed)
 "  [facility]  PATIENT: Ryan [name] DOB: Jan 14, 1996  REFERRING CLINICIAN: [name], Fonda LABOR, MD HISTORY FROM: patient  REASON FOR VISIT: new consult   HISTORICAL  CHIEF COMPLAINT:  Chief Complaint  Patient presents with   RM 7     Patient is here for bilateral ulnar neuropathy - has issues in both forearms. Has been going on for 5 years ( numbness/ tingling)     HISTORY OF PRESENT ILLNESS:   29 year old male here for evaluation of upper extremity numbness and tingling.  Symptoms started around 2021.  He reports intermittent burning, tingling sensation in the posterior forearm from his lateral epicondyle down his arm into the 4th and 5th digits.  Symptoms worse and triggered typically by prolonged typing or guitar playing.  At other times he has no symptoms.  Denies any weakness.  Symptoms slightly more the right than left side.  No problems with feet or legs.  No neck pain.  No low back pain.  No speech, vision, facial problems.   REVIEW OF SYSTEMS: Full 14 system review of systems performed and negative with exception of: as per HPI.  ALLERGIES: Allergies[1]  HOME MEDICATIONS: Outpatient Medications Prior to Visit  Medication Sig Dispense Refill   buPROPion  (WELLBUTRIN  XL) 300 MG 24 hr tablet Take 1 tablet (300 mg total) by mouth every morning. 30 tablet 2   clonazePAM  (KLONOPIN ) 0.5 MG tablet Take 1 tablet (0.5 mg total) by mouth 2 (two) times daily as needed for anxiety. 60 tablet 2   FLUoxetine  (PROZAC ) 40 MG capsule Take 1 capsule (40 mg total) by mouth daily. 30 capsule 2   gabapentin  (NEURONTIN ) 100 MG capsule Take 1 capsule (100 mg total) by mouth at bedtime. 30 capsule 2   lisdexamfetamine  (VYVANSE ) 70 MG capsule Take 1 capsule (70 mg total) by mouth daily. 30 capsule 0   lisdexamfetamine  (VYVANSE ) 70 MG capsule Take 1 capsule (70 mg total) by mouth daily. 30 capsule 0   meloxicam  (MOBIC ) 15 MG tablet Take 1 tablet (15 mg total) by mouth daily.  90 tablet 3   pantoprazole  (PROTONIX ) 40 MG tablet TAKE 1 TABLET BY MOUTH 2 TIMES A DAY 180 tablet 1   traZODone  (DESYREL ) 50 MG tablet Take 1 tablet (50 mg total) by mouth at bedtime. 30 tablet 2   No facility-administered medications prior to visit.    PAST MEDICAL HISTORY: Past Medical History:  Diagnosis Date   Allergy    seasonal   Anxiety    Bipolar affective disorder (HCC)    Depression    GERD (gastroesophageal reflux disease)    Seizures (HCC)    seizures began in NOV 2013 ,last was 2014    PAST SURGICAL HISTORY: Past Surgical History:  Procedure Laterality Date   TONSILLECTOMY      FAMILY HISTORY: Family History  Problem Relation Age of Onset   Sleep apnea Mother    ADD / ADHD Father    Mood Disorder Father    ADD / ADHD Sister    Mood Disorder Sister    Bipolar disorder Sister    ADD / ADHD Sister    Mood Disorder Sister    Bipolar disorder Sister    Sleep apnea Brother    ADD / ADHD Brother    Mood Disorder Brother    Bipolar disorder Brother    ADD / ADHD Brother    Mood Disorder Brother    Bipolar disorder Brother    Depression Maternal Grandfather  Mood Disorder Paternal Grandmother    Mood Disorder Paternal Grandfather    Migraines Neg Hx    Seizures Neg Hx    Stroke Neg Hx     SOCIAL HISTORY: Social History   Socioeconomic History   Marital status: Married    Spouse name: Not on file   Number of children: Not on file   Years of education: Not on file   Highest education level: 12th grade  Occupational History   Not on file  Tobacco Use   Smoking status: Never   Smokeless tobacco: Never  Vaping Use   Vaping status: Never Used  Substance and Sexual Activity   Alcohol use: No    Alcohol/week: 0.0 standard drinks of alcohol    Comment: 12-17-2015 per pt no    Drug use: No    Comment: 12-17-15 per pt no    Sexual activity: Never  Other Topics Concern   Not on file  Social History Narrative   Married   Employed full-time   No  alcohol tobacco or drug use   Caffeine - none    Social Drivers of Health   Tobacco Use: Low Risk (05/16/2024)   Patient History    Smoking Tobacco Use: Never    Smokeless Tobacco Use: Never    Passive Exposure: Not on file  Financial Resource Strain: Low Risk (12/11/2023)   Overall Financial Resource Strain (CARDIA)    Difficulty of Paying Living Expenses: Not hard at all  Food Insecurity: No Food Insecurity (12/11/2023)   Epic    Worried About Radiation Protection Practitioner of Food in the Last Year: Never true    Ran Out of Food in the Last Year: Never true  Transportation Needs: No Transportation Needs (12/11/2023)   Epic    Lack of Transportation (Medical): No    Lack of Transportation (Non-Medical): No  Physical Activity: Inactive (12/11/2023)   Exercise Vital Sign    Days of Exercise per Week: 0 days    Minutes of Exercise per Session: Not on file  Stress: Stress Concern Present (12/11/2023)   [employer] of Occupational Health - Occupational Stress Questionnaire    Feeling of Stress: Very much  Social Connections: Socially Integrated (12/11/2023)   Social Connection and Isolation Panel    Frequency of Communication with Friends and Family: More than three times a week    Frequency of Social Gatherings with Friends and Family: Twice a week    Attends Religious Services: More than 4 times per year    Active Member of [employer] or Organizations: Yes    Attends Engineer, Structural: More than 4 times per year    Marital Status: Married  Catering Manager Violence: Not on file  Depression (PHQ2-9): High Risk (12/12/2023)   Depression (PHQ2-9)    PHQ-2 Score: 12  Alcohol Screen: Not on file  Housing: Low Risk (12/11/2023)   Epic    Unable to Pay for Housing in the Last Year: No    Number of Times Moved in the Last Year: 1    Homeless in the Last Year: No  Utilities: Not on file  Health Literacy: Not on file     PHYSICAL EXAM  GENERAL EXAM/CONSTITUTIONAL: Vitals:  Vitals:    05/16/24 1118  BP: 137/85  Pulse: (!) 131  Weight: 168 lb 12.8 oz (76.6 kg)  Height: 5' 3 (1.6 m)   Body mass index is 29.9 kg/m. Wt Readings from Last 3 Encounters:  05/16/24 168 lb 12.8 oz (76.6  kg)  12/12/23 159 lb (72.1 kg)  10/07/22 168 lb 6 oz (76.4 kg)   Patient is in no distress; well developed, nourished and groomed; neck is supple  CARDIOVASCULAR: Examination of carotid arteries is normal; no carotid bruits Regular rate and rhythm, no murmurs Examination of peripheral vascular system by observation and palpation is normal  EYES: Ophthalmoscopic exam of optic discs and posterior segments is normal; no papilledema or hemorrhages No results found.  MUSCULOSKELETAL: Gait, strength, tone, movements noted in Neurologic exam below  NEUROLOGIC: MENTAL STATUS:      No data to display         awake, alert, oriented to person, place and time recent and remote memory intact normal attention and concentration language fluent, comprehension intact, naming intact fund of knowledge appropriate  CRANIAL NERVE:  2nd - no papilledema on fundoscopic exam 2nd, 3rd, 4th, 6th - pupils equal and reactive to light, visual fields full to confrontation, extraocular muscles intact, no nystagmus 5th - facial sensation symmetric 7th - facial strength symmetric 8th - hearing intact 9th - palate elevates symmetrically, uvula midline 11th - shoulder shrug symmetric 12th - tongue protrusion midline  MOTOR:  normal bulk and tone, full strength in the BUE, BLE  SENSORY:  normal and symmetric to light touch, pinprick, temperature, vibration; EXCEPT SLIGHTLY REDUCED IN FINGERTIPS  COORDINATION:  finger-nose-finger, fine finger movements normal  REFLEXES:  deep tendon reflexes 2+ and symmetric  GAIT/STATION:  narrow based gait     DIAGNOSTIC DATA (LABS, IMAGING, TESTING) - I reviewed patient records, labs, notes, testing and imaging myself where available.  Lab Results   Component Value Date   WBC 7.3 12/12/2023   HGB 15.9 12/12/2023   HCT 47.8 12/12/2023   MCV 90 12/12/2023   PLT 316 12/12/2023      Component Value Date/Time   NA 139 12/12/2023 1617   K 4.1 12/12/2023 1617   CL 102 12/12/2023 1617   CO2 21 12/12/2023 1617   GLUCOSE 82 12/12/2023 1617   GLUCOSE 127 (H) 03/10/2012 1902   BUN 12 12/12/2023 1617   CREATININE 1.03 12/12/2023 1617   CALCIUM 9.8 12/12/2023 1617   PROT 6.9 12/12/2023 1617   ALBUMIN 4.9 12/12/2023 1617   AST 18 12/12/2023 1617   ALT 17 12/12/2023 1617   ALKPHOS 63 12/12/2023 1617   BILITOT 0.6 12/12/2023 1617   GFRNONAA 113 01/24/2019 1119   GFRAA 130 01/24/2019 1119   Lab Results  Component Value Date   CHOL 113 12/12/2023   HDL 34 (L) 12/12/2023   LDLCALC 56 12/12/2023   TRIG 126 12/12/2023   CHOLHDL 3.3 12/12/2023   Lab Results  Component Value Date   HGBA1C 5.3 12/25/2018   Lab Results  Component Value Date   VITAMINB12 435 12/12/2023   Lab Results  Component Value Date   TSH 1.050 12/12/2023     ASSESSMENT AND PLAN  29 y.o. year old male here with:  Dx:  1. Hand numbness   2. Arm numbness     PLAN:  INTERMITTENT NUMBNESS / PAIN (task dependent such as typing or playing guitar; symptoms in forearms, hands, fingers; since ~ 2021; likely mild intermittent nerve irritation) - refer to OT for initial evaluation and tx; with intermittent symptoms, EMG/NCS less likely to be helpful at this time - if not improving after 4-6 weeks, then may consider MRI cervical spine and EMG/NCS  Orders Placed This Encounter  Procedures   Ambulatory referral to Occupational Therapy  Return for pending if symptoms worsen or fail to improve.    EDUARD FABIENE HANLON, MD 05/16/2024, 11:42 AM Certified in Neurology, Neurophysiology and Neuroimaging  [facility] [address], [address] [place], [place] [address] [phone]     [1]  Allergies Allergen Reactions   Sulfa  Antibiotics Rash   "

## 2024-06-18 ENCOUNTER — Encounter: Admitting: Rehabilitative and Restorative Service Providers"
